# Patient Record
Sex: Female | Born: 1960 | Race: White | Hispanic: No | Smoking: Current every day smoker
Health system: Southern US, Community
[De-identification: ages and names within clinical notes are randomized; demographics above are authoritative.]

## PROBLEM LIST (undated history)

## (undated) ENCOUNTER — Ambulatory Visit: Admission: EM | Discharge status: Absent without leave | Payer: 59

## (undated) DIAGNOSIS — K559 Vascular disorder of intestine, unspecified: Secondary | ICD-10-CM

## (undated) HISTORY — PX: SIGMOIDOSCOPY: SUR1295

## (undated) HISTORY — PX: COLONOSCOPY: SHX174

---

## 2006-05-04 ENCOUNTER — Emergency Department: Payer: Self-pay | Admitting: Internal Medicine

## 2008-04-01 ENCOUNTER — Ambulatory Visit: Payer: Self-pay | Admitting: Family Medicine

## 2009-11-10 ENCOUNTER — Ambulatory Visit: Payer: Self-pay | Admitting: Family Medicine

## 2009-11-19 ENCOUNTER — Ambulatory Visit: Payer: Self-pay | Admitting: Family Medicine

## 2011-07-06 ENCOUNTER — Ambulatory Visit: Payer: Self-pay | Admitting: Family Medicine

## 2011-11-19 ENCOUNTER — Ambulatory Visit: Payer: Self-pay | Admitting: Internal Medicine

## 2012-07-22 LAB — LIPID PANEL
Cholesterol: 138 mg/dL (ref 0–200)
HDL: 63 mg/dL (ref 35–70)
LDL Cholesterol: 66 mg/dL
TRIGLYCERIDES: 46 mg/dL (ref 40–160)

## 2013-05-29 ENCOUNTER — Ambulatory Visit: Payer: Self-pay | Admitting: Family Medicine

## 2013-06-01 LAB — BETA STREP CULTURE(ARMC)

## 2013-06-02 ENCOUNTER — Ambulatory Visit: Payer: Self-pay

## 2013-06-05 ENCOUNTER — Emergency Department: Payer: Self-pay | Admitting: Internal Medicine

## 2013-07-26 LAB — HM PAP SMEAR: HM PAP: NORMAL

## 2013-07-26 LAB — CBC AND DIFFERENTIAL: HEMOGLOBIN: 14.4 g/dL (ref 12.0–16.0)

## 2013-07-26 LAB — BASIC METABOLIC PANEL
BUN: 11 mg/dL (ref 4–21)
Creatinine: 0.8 mg/dL (ref <=1.1)

## 2013-07-26 LAB — TSH: TSH: 2.8 u[IU]/mL (ref <=5.90)

## 2014-06-16 ENCOUNTER — Ambulatory Visit: Payer: Self-pay | Admitting: Family Medicine

## 2014-06-16 LAB — RAPID STREP-A WITH REFLX: Micro Text Report: NEGATIVE

## 2014-06-19 LAB — BETA STREP CULTURE(ARMC)

## 2014-09-13 ENCOUNTER — Inpatient Hospital Stay: Payer: Self-pay | Admitting: Internal Medicine

## 2014-09-13 LAB — COMPREHENSIVE METABOLIC PANEL
ANION GAP: 5 — AB (ref 7–16)
AST: 35 U/L (ref 15–37)
Albumin: 4.2 g/dL (ref 3.4–5.0)
Alkaline Phosphatase: 186 U/L — ABNORMAL HIGH
BUN: 23 mg/dL — AB (ref 7–18)
Bilirubin,Total: 0.6 mg/dL (ref 0.2–1.0)
CREATININE: 0.98 mg/dL (ref 0.60–1.30)
Calcium, Total: 9.2 mg/dL (ref 8.5–10.1)
Chloride: 108 mmol/L — ABNORMAL HIGH (ref 98–107)
Co2: 25 mmol/L (ref 21–32)
EGFR (African American): >60
EGFR (Non-African Amer.): >60
Glucose: 106 mg/dL — ABNORMAL HIGH (ref 65–99)
Osmolality: 280 (ref 275–301)
POTASSIUM: 4.6 mmol/L (ref 3.5–5.1)
SGPT (ALT): 27 U/L
SODIUM: 138 mmol/L (ref 136–145)
TOTAL PROTEIN: 7.7 g/dL (ref 6.4–8.2)

## 2014-09-13 LAB — CBC WITH DIFFERENTIAL/PLATELET
BASOS ABS: 0 10*3/uL (ref 0.0–0.1)
BASOS PCT: 0.4 %
Basophil #: 0.1 10*3/uL (ref 0.0–0.1)
Basophil %: 0.9 %
EOS ABS: 0 10*3/uL (ref 0.0–0.7)
Eosinophil #: 0.2 10*3/uL (ref 0.0–0.7)
Eosinophil %: 0.4 %
Eosinophil %: 2.9 %
HCT: 48 % — AB (ref 35.0–47.0)
HCT: 41.8 % (ref 35.0–47.0)
HGB: 15.5 g/dL (ref 12.0–16.0)
HGB: 13.8 g/dL (ref 12.0–16.0)
LYMPHS ABS: 1 10*3/uL (ref 1.0–3.6)
LYMPHS PCT: 8.8 %
Lymphocyte #: 2 10*3/uL (ref 1.0–3.6)
Lymphocyte %: 25.5 %
MCH: 31.2 pg (ref 26.0–34.0)
MCH: 31.6 pg (ref 26.0–34.0)
MCHC: 32.4 g/dL (ref 32.0–36.0)
MCHC: 32.9 g/dL (ref 32.0–36.0)
MCV: 96 fL (ref 80–100)
MCV: 96 fL (ref 80–100)
MONO ABS: 0.6 x10 3/mm (ref 0.2–0.9)
MONOS PCT: 8.3 %
Monocyte #: 0.9 x10 3/mm (ref 0.2–0.9)
Monocyte %: 7.6 %
NEUTROS PCT: 63.1 %
Neutrophil #: 9.4 10*3/uL — ABNORMAL HIGH (ref 1.4–6.5)
Neutrophil #: 4.9 10*3/uL (ref 1.4–6.5)
Neutrophil %: 82.1 %
PLATELETS: 172 10*3/uL (ref 150–440)
Platelet: 201 10*3/uL (ref 150–440)
RBC: 4.97 10*6/uL (ref 3.80–5.20)
RBC: 4.35 10*6/uL (ref 3.80–5.20)
RDW: 13.5 % (ref 11.5–14.5)
RDW: 13.4 % (ref 11.5–14.5)
WBC: 11.5 10*3/uL — ABNORMAL HIGH (ref 3.6–11.0)
WBC: 7.7 10*3/uL (ref 3.6–11.0)

## 2014-09-13 LAB — URINALYSIS, COMPLETE
BACTERIA: NONE SEEN
BILIRUBIN, UR: NEGATIVE
BLOOD: NEGATIVE
Glucose,UR: NEGATIVE mg/dL (ref 0–75)
Leukocyte Esterase: NEGATIVE
NITRITE: NEGATIVE
PROTEIN: NEGATIVE
Ph: 5 (ref 4.5–8.0)
RBC,UR: 1 /HPF (ref 0–5)
Specific Gravity: 1.019 (ref 1.003–1.030)
Squamous Epithelial: NONE SEEN
WBC UR: 1 /HPF (ref 0–5)

## 2014-09-14 LAB — CBC WITH DIFFERENTIAL/PLATELET
BASOS ABS: 0.1 10*3/uL (ref 0.0–0.1)
Basophil %: 2 %
Eosinophil #: 0.2 10*3/uL (ref 0.0–0.7)
Eosinophil %: 3.5 %
HCT: 42.4 % (ref 35.0–47.0)
HGB: 13.8 g/dL (ref 12.0–16.0)
LYMPHS ABS: 2 10*3/uL (ref 1.0–3.6)
Lymphocyte %: 29.2 %
MCH: 31.5 pg (ref 26.0–34.0)
MCHC: 32.7 g/dL (ref 32.0–36.0)
MCV: 97 fL (ref 80–100)
Monocyte #: 0.5 x10 3/mm (ref 0.2–0.9)
Monocyte %: 7.4 %
Neutrophil #: 3.9 10*3/uL (ref 1.4–6.5)
Neutrophil %: 57.9 %
PLATELETS: 172 10*3/uL (ref 150–440)
RBC: 4.39 10*6/uL (ref 3.80–5.20)
RDW: 13.5 % (ref 11.5–14.5)
WBC: 6.7 10*3/uL (ref 3.6–11.0)

## 2014-09-14 LAB — BASIC METABOLIC PANEL
ANION GAP: 7 (ref 7–16)
BUN: 13 mg/dL (ref 7–18)
Calcium, Total: 8.2 mg/dL — ABNORMAL LOW (ref 8.5–10.1)
Chloride: 111 mmol/L — ABNORMAL HIGH (ref 98–107)
Co2: 21 mmol/L (ref 21–32)
Creatinine: 0.79 mg/dL (ref 0.60–1.30)
EGFR (African American): >60
EGFR (Non-African Amer.): >60
Glucose: 83 mg/dL (ref 65–99)
Osmolality: 277 (ref 275–301)
POTASSIUM: 4.1 mmol/L (ref 3.5–5.1)
Sodium: 139 mmol/L (ref 136–145)

## 2014-11-07 ENCOUNTER — Ambulatory Visit: Payer: Self-pay | Admitting: Gastroenterology

## 2014-11-07 DIAGNOSIS — K559 Vascular disorder of intestine, unspecified: Secondary | ICD-10-CM | POA: Insufficient documentation

## 2015-01-20 NOTE — Discharge Summary (Signed)
PATIENT NAME:  Yvette Barr, Yvette Barr MR#:  [id number] DATE OF BIRTH:  [birth year]  DATE OF ADMISSION:  09/13/2014 DATE OF DISCHARGE:  09/15/2014  PRESENTING COMPLAINT:  Rectal bleeding.   DISCHARGE DIAGNOSES:  1.  Acute colitis.  2.  Flexible sigmoidoscopy showed congested, inflamed and ulcerated mucosa in the proximal sigmoid colon and in the mid sigmoid colon. Diverticulosis in the sigmoid colon.   CODE STATUS: FULL CODE.   MEDICATIONS:  1.  Flagyl 500 mg t.i.d.  2.  Cipro 500 mg b.i.d.  3.  Acetaminophen 2 tablets every four hours as needed.   DISCHARGE DIET: A full liquid for 3 to 4 days then low residue diet.   FOLLOWUP:  1.  Dr. Gustavo Lah in 2 to 4 weeks.  2.  Follow up with Dr. Edilia Bo your primary care physician.   GASTROENTEROLOGY CONSULTATION:  Dr. Gustavo Lah.   LABORATORY DATA: On discharge CBC and basic metabolic panel within normal limits.    VITAL SIGNS:  On discharge:  Temperature was 97.9, pulse is 55. Blood pressure is 115/76 saturations are 98% on room air.   PHYSICAL EXAMINATION:  RESPIRATORY: Clear to auscultation bilaterally. No rales, rhonchi, respiratory distress or labored breathing.  CARDIOVASCULAR: Both the heart sounds are normal. No leg edema.  ABDOMEN:  No tenderness. No liver, spleen enlargement, no hernias. Bowels soft, on palpation, normal sounds.  EXTREMITIES: Negative for edema.  SKIN: No rashes or ulcer.  NEUROLOGIC: Clinically intact.   BRIEF SUMMARY OF HOSPITAL COURSE: Yvette Barr is a 54 year old Caucasian female with no past medical history who comes to the Emergency Room with:  1.  Acute left-sided colitis. She was started on IV Cipro Flagyl, white count was stable, GI consultation was done with Dr. Gustavo Lah. Flexible sigmoidoscopy was done, results as above were noted. The patient was switched to p.o. Flagyl. Cipro, will follow up with Dr. Gustavo Lah in 2 to 4 weeks.  2.  Chronic tobacco abuse, advised smoking cessation.  Overall hospital stay otherwise  remained stable.   CODE STATUS: THE PATIENT REMAINED A FULL CODE.    TIME SPENT:  Forty minutes.   ____________________________ Hart Rochester Posey Pronto, MD sap:at D: 09/25/2014 10:25:13 ET T: 09/25/2014 12:18:34 ET JOB#: [id number]  cc: Amillion Scobee A. Posey Pronto, MD, <Dictator> Lollie Sails, MD Owens Loffler, MD  Ilda Basset MD ELECTRONICALLY SIGNED 09/26/2014 18:38

## 2015-01-20 NOTE — Consult Note (Signed)
PATIENT NAME:  Yvette Barr, MCMILLEN MR#:  [id number] DATE OF BIRTH:  [birth year]  DATE OF CONSULTATION:  09/14/2014  REFERRING PHYSICIAN:  Dr.  Bridgett Larsson CONSULTING PHYSICIAN:  Lollie Sails, MD   REASON FOR CONSULTATION: Rectal bleeding.   HISTORY OF PRESENT ILLNESS: Yvette Barr is a pleasant 54 year old Caucasian female who was in her usual state of health until yesterday at about 10 to 11 in the morning. She began to have some abdominal crampiness in the lower aspect of the abdomen, particularly suprapubic. This was then followed by a bloody bowel movement.  This was bright red in nature.  She has never had an episode of this previously.  Her pain is low in the central abdomen. It is somewhat better today than it was yesterday. There were several episodes yesterday and 2 episodes overnight.  She has been hemodynamically stable.  Her hemogram has been stable, as well. She does have a history of hemorrhoids.  She has never had a colonoscopy in the past. She has never had peptic ulcer disease. She does take Aleve PM every evening.  She denies any heartburn or dysphagia.  She states that on this episode of bleeding, when she first got the pain, there was some nausea but no vomiting, and sometime later after the rectal bleeding started, there was no further nausea.  She generally has a bowel movement once a week, does have problems with constipation.  There has been no black stools or mucousy stools.  She has tried Metamucil-type products in the past for constipation unsuccessfully.  About a year ago she had a gallbladder evaluation due to epigastric discomfort. She was placed on Nexium which she took for about a month, has not taken it since, however, has minimal epigastric discomfort currently. She has been started on IV antibiotics on admission.   PAST MEDICAL HISTORY: Denies any previous medical treatment or surgeries.   SOCIAL HISTORY: She does smoke 1/2 pack of cigarettes daily. She does not drink alcohol or use  illicit drugs.   GASTROINTESTINAL FAMILY HISTORY: Negative for colorectal cancer, liver disease or ulcers.   REVIEW OF SYSTEMS:  Ten systems reviewed per admission history and physical, agree with same.   PHYSICAL EXAMINATION:  VITAL SIGNS: Temperature is 97.5, pulse 50, respirations 17, blood pressure 119/79, pulse oximetry 98%.  GENERAL: She is a thin 54 year old Caucasian female in no acute distress.  HEENT: Normocephalic, atraumatic.  EYES: Anicteric.  NOSE: Septum midline. No lesions.  OROPHARYNX: No lesions.  NECK: Supple. No JVD.  HEART: Regular rate and rhythm without rub or gallop.  LUNGS: Bilaterally clear.  ABDOMEN: Soft. She is tender to palpation across the lower abdomen particularly centrally between the umbilicus and pubic arch. There are no masses, rebound, or organomegaly. Bowel sounds positive, normoactive.  RECTAL: Anorectal examination shows a pinkish. more bright-ish red scant effluent. This is brightly Hemoccult positive. There are no masses noted. EXTREMITIES: No clubbing, cyanosis, or edema.  NEUROLOGICAL: Cranial nerves II through XII grossly intact. Muscle strength bilaterally equal and symmetric.   LABORATORY, DIAGNOSTIC, AND RADIOLOGICAL DATA:  Include the following: On admission to the hospital yesterday afternoon she had a glucose of 106, BUN 23, creatinine 0.98, sodium 138, potassium 4.6, chloride 108, bicarbonate 25, calcium 9.2. Hepatic profile normal with the exception of slight elevation of alkaline phosphatase at 186.  Her hemogram showed a white count of 11.5, hemoglobin and hematocrit 15.5 and 48.0, platelet count 201,000.  MCV 96. She has had 2 hemograms subsequent, 1 late  last night, 1 early this morning; both of these showing a 13.8 hemoglobin.  Urinalysis was normal.  She did have an abdominal pelvic CT scan with contrast due to the bloody stool and suprapubic abdominal pain. The stomach was noted to be normal without wall thickening. There was some wall  thickening noted in the sigmoid, descending and possibly distal transverse colon which was described as moderate. Impression on the scan was that she had a left-sided colitis "given the history, ischemia is possible, although there is age-advanced atherosclerosis, the mesenteric vessels are patent. Differential considerations would include infectious colitis. Inflammatory bowel disease was felt unlikely given the absence or rectal involvement per CT."  It is of note, that she had no prodrome of loose stools prior to this episode of bleeding.   ASSESSMENT:   Hematochezia, abnormal CT scan and possibly consistent with colitis, etiology undetermined. No previous episodes. The patient does take NSAIDs regularly, however is not on a blood thinner.  Review of her previous medications indicates that she has probably been treated for asthma-possible chronic obstructive pulmonary disease type things in the past including antibiotics and Medrol dose packs.   RECOMMENDATION:  We will proceed today with an un-sedated flexible sigmoidoscopy.  Depending on this result, we may need to pursue full colonoscopy. I have discussed the risks, benefits, and complications of flexible sigmoidoscopy. to include but not limited to, bleeding, infection, perforation. She wishes to proceed. I have discussed with her that we may need to use sedation if she is uncomfortable. Further recommendations to follow.  Discuss with Dr. Fritzi Mandes.    ____________________________ Lollie Sails, MD mus:DT D: 09/14/2014 12:47:00 ET T: 09/14/2014 13:18:04 ET JOB#: [id number]  cc: Lollie Sails, MD, <Dictator> Lollie Sails MD ELECTRONICALLY SIGNED 09/19/2014 1:43

## 2015-01-20 NOTE — H&P (Signed)
PATIENT NAME:  Yvette Barr, Yvette Barr MR#:  [id number] DATE OF BIRTH:  02-13-1961  DATE OF ADMISSION:  09/13/2014  PRIMARY CARE PHYSICIAN:  Copland, MD   REFERRING PHYSICIAN:  Lenard Lance, MD   CHIEF COMPLAINT: Rectal bleeding today.   HISTORY OF PRESENT ILLNESS: A 54 year old Caucasian female with no past medical history presented to the ED with rectal bleeding today. The patient is alert and oriented, in no acute distress. The patient has started to have abdominal pain in the lower abdomen, which is aching in the morning. The patient also has some nausea but no vomiting, but the patient started to have rectal bleeding in the afternoon, which is about 6 times, total about half a cup of fresh blood. The patient denies any fever or chills, denies any melena, denies any dysuria or hematuria.   The patient got a CAT scan in the ED which showed left side colitis.   The patient was treated with Cipro and Flagyl 1 dose in the ED.   PAST MEDICAL HISTORY: None.   PAST SURGICAL HISTORY: None.   SOCIAL HISTORY: Smokes 1/2 pack a day since teenager; no alcohol drinking, or illicit drugs.   FAMILY HISTORY: No hypertension, diabetes, cancer, heart attack or stroke.   ALLERGIES: CODEINE   HOME MEDICATIONS: None.   REVIEW OF SYSTEMS:   CONSTITUTIONAL: The patient denies any fever or chills. No headache or dizziness. No weakness.  EYES: No double vision or blurry vision.  EARS AND NOSE AND THROAT: No postnasal drip, slurred speech or dysphagia.  CARDIOVASCULAR: No chest pain, palpitation, orthopnea, or nocturnal dyspnea. No leg edema.  PULMONARY: No cough, sputum, shortness of breath, or hematemesis.  GASTROINTESTINAL: Positive for abdominal pain, nausea, and bloody stool. No vomiting or  diarrhea. No melena.  GENITOURINARY:  No dysuria, hematuria, or incontinence.  SKIN: No rash or jaundice.  NEUROLOGIC: No syncope, loss of consciousness, or seizure.  HEMATOLOGIC: No easy bruising or bleeding.   ENDOCRINE: No polyuria, polydipsia, heat or cold intolerance.   VITAL SIGNS: Temperature 97.3, blood pressure 134/92, pulse 56, with O2 saturation 100% on room air.   PHYSICAL EXAMINATION:  GENERAL: The patient is alert, awake, oriented, in no acute distress.  HEENT: Pupils round, equal and reactive to light and accommodation. Moist oral mucosa. Clear oropharynx.  NECK: Supple. No JVD or carotid bruit. No lymphadenopathy. No thyromegaly.  CARDIOVASCULAR: S1 and S2. Regular rate and rhythm. No murmurs or gallops.  PULMONARY: Bilateral air entry. No wheezing or rales. No use of accessory muscle to breathe.  ABDOMEN: Soft. No distention but has mild tenderness in the middle of the abdomen. No rigidity. No rebound. Bowel sounds present.  No organomegaly.  EXTREMITIES: No edema, clubbing or cyanosis. No calf tenderness. Bilateral pedal pulses present.  SKIN: No rash or jaundice.  NEUROLOGIC: A and O x 3. No focal deficit. Power 5/5. Sensation intact.   DIAGNOSTIC DATA: CAT scan of the abdomen and pelvis showing left-sided colitis.  Urinalysis is negative. WBC 11.5, hemoglobin 15.5, platelets 201,000, glucose 106, BUN 23, creatinine 0.98, electrolytes normal.   IMPRESSIONS: 1.  Acute colitis.  2.  Dehydration.  3.  Gastrointestinal bleeding.  4.  Tobacco abuse.   PLAN OF TREATMENT: 1.  The patient will be admitted to the medical floor. We will continue Cipro, Flagyl for acute colitis.  2.  For GI bleeding we will follow up hemoglobin. Start Protonix 40 mg IV b.i.d. we will get a GI consult.  3.  For dehydration,  we will start IV fluid support and follow up BMP.  4.  For tobacco abuse, smoking cessation was counseled for 3 to 4 minutes, we will give nicotine patch.   I discussed the patient's condition and plan of treatment with the patient, the patient wants full code.   TIME SPENT: About 48 minutes.    ____________________________ Demetrios Loll, MD qc:nt D: 09/13/2014 18:16:05  ET T: 09/13/2014 18:59:28 ET JOB#: [id number]  cc: Demetrios Loll, MD, <Dictator> Demetrios Loll MD ELECTRONICALLY SIGNED 09/14/2014 13:34

## 2015-01-20 NOTE — Consult Note (Signed)
Brief Consult Note: Diagnosis: hematochezia, abnormal ct scan, lower abdominal pain.   Patient was seen by consultant.   Consult note dictated.   Recommend to proceed with surgery or procedure.   Recommend further assessment or treatment.   Orders entered.   Discussed with Attending MD.   Comments: Please see ful GI consult [phone].  Patietn presenting after episodes of hematochezia that started after onset of lower abdominal pain.  Hemodynamically stable.  Considerations from CT of possible ischemic/infective/inflammatory colitis.   Will do flexible sigmoidoscopy this afternoon.  I have discussed the risks benefits and complications of proceedure to include not limited to bleeding infection perforation and sedation and she wishes to proceed.   Further recs to follow.  Electronic Signatures: Loistine Simas (MD)  (Signed 17-Dec-15 12:55)  Authored: Brief Consult Note   Last Updated: 17-Dec-15 12:55 by Loistine Simas (MD)

## 2015-01-22 LAB — SURGICAL PATHOLOGY

## 2015-05-29 ENCOUNTER — Ambulatory Visit (INDEPENDENT_AMBULATORY_CARE_PROVIDER_SITE_OTHER): Payer: Managed Care, Other (non HMO) | Admitting: Family Medicine

## 2015-05-29 ENCOUNTER — Telehealth: Payer: Self-pay

## 2015-05-29 ENCOUNTER — Encounter: Payer: Self-pay | Admitting: Family Medicine

## 2015-05-29 VITALS — BP 120/80 | HR 72 | Temp 98.1°F | Ht 66.0 in | Wt 147.0 lb

## 2015-05-29 DIAGNOSIS — J0100 Acute maxillary sinusitis, unspecified: Secondary | ICD-10-CM | POA: Diagnosis not present

## 2015-05-29 DIAGNOSIS — J40 Bronchitis, not specified as acute or chronic: Secondary | ICD-10-CM

## 2015-05-29 MED ORDER — BENZONATATE 100 MG PO CAPS: 100.0000 mg | ORAL_CAPSULE | Freq: Two times a day (BID) | ORAL | Status: DC | PRN

## 2015-05-29 MED ORDER — ALBUTEROL SULFATE HFA 108 (90 BASE) MCG/ACT IN AERS: 2.0000 | INHALATION_SPRAY | Freq: Four times a day (QID) | RESPIRATORY_TRACT | Status: DC | PRN

## 2015-05-29 MED ORDER — AMOXICILLIN 500 MG PO CAPS: 500.0000 mg | ORAL_CAPSULE | Freq: Three times a day (TID) | ORAL | Status: DC

## 2015-05-29 NOTE — Patient Instructions (Signed)
Smoking: Ways to Quit   Know why you want to quit.   When you quit smoking, your body gets to work repairing damaged tissues. Here are some of the health benefits:   You stop the destruction of your lungs.  Your lungs are better able to fight colds, and other respiratory infections.  You decrease your risk of cancer, heart disease, strokes, and circulation problems.   In addition, when you quit you will:   Feel more in control of your life.  Have better smelling hair, breath, clothes, home, and car.  Have more stamina for activities.  Save money.  Protect your family and friends from the dangers of secondhand smoke.   Smoking is an addictive habit. Most former smokers make several attempts to quit before they finally succeed. So, never say, "I can't." Just keep trying.   Set a quit date.   Set a date for when you will stop smoking. Don't buy cigarettes to carry you beyond your last day. Tell your family and friends you plan to quit, and ask for their support and encouragement. Ask them not to offer you cigarettes.  Make a plan.   5 Days Before Your Quit Date   Think about your reasons for quitting.  Tell your friends and family you are planning to quit.  Stop buying cigarettes.   4 Days Before Your Quit Date   Pay attention to when and why you smoke.  Think of other things to hold in your hand instead of a cigarette.  Think of habits or routines to change.   3 Days Before Your Quit Date   Plan what you will do with the extra money when you stop buying cigarettes.  Think of whom you can reach out to when you need help.   2 Days Before Your Quit Date   Consider buying nonprescription nicotine patches or nicotine gum. Or see your health care provider to get a prescription for the nicotine inhaler, nasal spray, or other medicine that can help.     1 Day Before Your Quit Date   Put away lighters and ashtrays.  Throw away all cigarettes and matches - no emergency stashes  are allowed!  Clean your clothes to get rid of the smell of cigarette smoke.   Quit Day   Keep very busy.  Remind family and friends that this is your quit day.  Stay away from alcohol.  Stay away from places where you used to smoke and people you used to smoke with.  Give yourself a treat or do something else special.   Commit to staying quit.   Make sure that all your cigarettes and ashtrays are thrown away.   If you keep cigarettes or ashtrays around, sooner or later you'll break down and smoke one, then another, then another, and so on. Throw them away. Make it hard to start again.   Because you are used to having something in your mouth, you may want to chew gum as a substitute for smoking. Or munch on carrots or celery.   Spend time with nonsmokers rather than with smokers.   Think of yourself as a nonsmoker. Tell other people that you are a nonsmoker (for example, in restaurants). Stay away from places where there are a lot of smokers, such as bars. Avoid spending time with smokers. You can't tell others not to smoke, but you don't have to sit with them while they do. Plan on walking away from cigarette smoke. Spend time   with nonsmokers and sit in the nonsmoking section of restaurants.   Be prepared for relapse or difficult situations.   Most people who go back to smoking cigarettes do so within the first 3 months after quitting. Many people try 5 or more times before they successfully quit. Avoid drinking alcohol, because it lowers your chances of success. Don't be distracted by the weight you may gain after quitting. Smokers usually do not gain more than 10 pounds when they stop smoking. Learn new ways to improve your mood and overcome depression.   Start an exercise program.   As you become more fit, you will not want the nicotine effects in your body. Regular exercise will also help keep you from gaining weight.   Keep your hands busy.   You may not know what to do with  your hands for a while. Try reading, knitting, needlework, pottery, drawing, making a plastic model, or doing a jigsaw puzzle. Join special interest groups that keep you involved in your hobby.      Take on new activities.   Change your routine. Take on new activities that don't include smoking. Join an exercise group and work out regularly. Sign up for an evening class or a join a study group at your place of worship. Go on more outings with your family or friends. Learn ways to relax and manage stress.   Join a quit-smoking program if it helps.   Some people do better in groups, or with a set of instructions to follow. That's fine, too. Remember, the goal is to quit smoking. It doesn't matter how you do it.   Consider using nicotine replacement therapy.   Nicotine is the drug that is in tobacco. You can use nicotine patches or gum, available without a prescription at your local pharmacy, to help you quit smoking. Quitting smoking is a two-step process. It includes breaking the physical addiction to nicotine and breaking the smoking habit. Nicotine replacement helps take care of the nicotine addiction so that you can focus on breaking the habit.   Your health care provider can prescribe nicotine substitutes that can almost double your chances of quitting for good. They are:   Zyban (bupropion HCL)  nicotine inhaler  nicotine lozenge  nicotine nasal spray  nicotine patch.   None of these treatments is a miracle cure. Quitting can be hard work, but you can learn to live without cigarettes in your daily life.  

## 2015-05-29 NOTE — Progress Notes (Signed)
Name: Yvette Barr   MRN: [id number]    DOB: 08/18/1961   Date:05/29/2015       Progress Note  Subjective  Chief Complaint  Chief Complaint  Patient presents with  . Sinusitis    pressure in face, teeth hurt, yellow production, sores in nose x 2 days    Sinusitis This is a new problem. The current episode started in the past 7 days. The problem has been gradually worsening since onset. The maximum temperature recorded prior to her arrival was 100.4 - 100.9 F. The pain is moderate (upper tooth pain). Associated symptoms include chills, congestion, coughing, diaphoresis, a hoarse voice, shortness of breath, sinus pressure, a sore throat and swollen glands. Pertinent negatives include no ear pain, headaches or neck pain. (Productive yellow) Past treatments include acetaminophen and oral decongestants. The treatment provided no relief.    No problem-specific assessment & plan notes found for this encounter.   History reviewed. No pertinent past medical history.  History reviewed. No pertinent past surgical history.  History reviewed. No pertinent family history.  Social History   Social History  . Marital Status: Married    Spouse Name: N/A  . Number of Children: N/A  . Years of Education: N/A   Occupational History  . Not on file.   Social History Main Topics  . Smoking status: Current Every Day Smoker  . Smokeless tobacco: Not on file  . Alcohol Use: 0.0 oz/week    0 Standard drinks or equivalent per week  . Drug Use: No  . Sexual Activity: Yes   Other Topics Concern  . Not on file   Social History Narrative  . No narrative on file    Allergies  Allergen Reactions  . Codeine   . Morphine And Related      Review of Systems  Constitutional: Positive for chills and diaphoresis. Negative for fever, weight loss and malaise/fatigue.  HENT: Positive for congestion, hoarse voice, sinus pressure and sore throat. Negative for ear discharge and ear pain.   Eyes:  Negative for blurred vision.  Respiratory: Positive for cough and shortness of breath. Negative for sputum production and wheezing.   Cardiovascular: Negative for chest pain, palpitations and leg swelling.  Gastrointestinal: Negative for heartburn, nausea, abdominal pain, diarrhea, constipation, blood in stool and melena.  Genitourinary: Negative for dysuria, urgency, frequency and hematuria.  Musculoskeletal: Negative for myalgias, back pain, joint pain and neck pain.  Skin: Negative for rash.  Neurological: Negative for dizziness, tingling, sensory change, focal weakness and headaches.  Endo/Heme/Allergies: Negative for environmental allergies and polydipsia. Does not bruise/bleed easily.  Psychiatric/Behavioral: Negative for depression and suicidal ideas. The patient is not nervous/anxious and does not have insomnia.      Objective  Filed Vitals:   05/29/15 1338  BP: 120/80  Pulse: 72  Temp: 98.1 F (36.7 C)  TempSrc: Oral  Height: 5\' 6"  (1.676 m)  Weight: 147 lb (66.679 kg)  SpO2: 98%    Physical Exam  Constitutional: She is well-developed, well-nourished, and in no distress. No distress.  HENT:  Head: Normocephalic and atraumatic.  Right Ear: External ear normal.  Left Ear: External ear normal.  Nose: Nose normal.  Mouth/Throat: Oropharynx is clear and moist.  Eyes: Conjunctivae and EOM are normal. Pupils are equal, round, and reactive to light. Right eye exhibits no discharge. Left eye exhibits no discharge.  Neck: Normal range of motion. Neck supple. No JVD present. No thyromegaly present.  Cardiovascular: Normal rate, regular rhythm, normal  heart sounds and intact distal pulses.  Exam reveals no gallop and no friction rub.   No murmur heard. Pulmonary/Chest: Effort normal and breath sounds normal.  Abdominal: Soft. Bowel sounds are normal. She exhibits no mass. There is no tenderness. There is no guarding.  Musculoskeletal: Normal range of motion. She exhibits no  edema.  Lymphadenopathy:    She has no cervical adenopathy.  Neurological: She is alert. She has normal reflexes.  Skin: Skin is warm and dry. She is not diaphoretic.  Psychiatric: Mood and affect normal.      Assessment & Plan  Problem List Items Addressed This Visit    None    Visit Diagnoses    Bronchitis    -  Primary    Relevant Medications    amoxicillin (AMOXIL) 500 MG capsule    benzonatate (TESSALON) 100 MG capsule    albuterol (PROVENTIL HFA;VENTOLIN HFA) 108 (90 BASE) MCG/ACT inhaler    Acute maxillary sinusitis, recurrence not specified        Relevant Medications    amoxicillin (AMOXIL) 500 MG capsule    benzonatate (TESSALON) 100 MG capsule         Dr. Aaima Gaddie [facility]  05/29/2015

## 2015-07-26 ENCOUNTER — Encounter: Payer: Self-pay | Admitting: Internal Medicine

## 2015-07-26 DIAGNOSIS — G4700 Insomnia, unspecified: Secondary | ICD-10-CM | POA: Insufficient documentation

## 2015-07-26 DIAGNOSIS — R636 Underweight: Secondary | ICD-10-CM | POA: Insufficient documentation

## 2015-07-26 DIAGNOSIS — F17200 Nicotine dependence, unspecified, uncomplicated: Secondary | ICD-10-CM | POA: Insufficient documentation

## 2015-07-26 DIAGNOSIS — K5900 Constipation, unspecified: Secondary | ICD-10-CM | POA: Insufficient documentation

## 2015-07-27 ENCOUNTER — Encounter: Payer: Self-pay | Admitting: Internal Medicine

## 2015-07-27 ENCOUNTER — Ambulatory Visit (INDEPENDENT_AMBULATORY_CARE_PROVIDER_SITE_OTHER): Payer: Managed Care, Other (non HMO) | Admitting: Internal Medicine

## 2015-07-27 VITALS — BP 104/64 | HR 72 | Ht 66.0 in | Wt 149.6 lb

## 2015-07-27 DIAGNOSIS — J3489 Other specified disorders of nose and nasal sinuses: Secondary | ICD-10-CM

## 2015-07-27 DIAGNOSIS — K559 Vascular disorder of intestine, unspecified: Secondary | ICD-10-CM

## 2015-07-27 DIAGNOSIS — Z1239 Encounter for other screening for malignant neoplasm of breast: Secondary | ICD-10-CM

## 2015-07-27 DIAGNOSIS — F17200 Nicotine dependence, unspecified, uncomplicated: Secondary | ICD-10-CM | POA: Diagnosis not present

## 2015-07-27 DIAGNOSIS — K5901 Slow transit constipation: Secondary | ICD-10-CM | POA: Diagnosis not present

## 2015-07-27 NOTE — Patient Instructions (Signed)
Use Saline nasal spray as needed Apply neosporin ointment to right nostril at bedtime

## 2015-07-27 NOTE — Progress Notes (Signed)
Date:  07/27/2015   Name:  Yvette Barr   DOB:  03-Feb-1961   MRN:  [id number]   Chief Complaint: Employment Physical  Patient presents for a physical exam for employment. She does needs documentation to affect her insurance company. She is feeling well. Recovered from a recent sinus infection. She does still complain of crusting in the right nostril with some dried blood every morning. Is concerned about how that should be treated. She was hospitalized last year with constipation and abdominal pain. Colonoscopy showed ischemic colitis. She was prescribed Amitiza for constipation which has been of no benefit. No further problems with pain or bleeding has been noted.  Review of Systems  Constitutional: Negative for fever, chills and fatigue.  HENT: Positive for nosebleeds and sinus pressure. Negative for ear discharge, hearing loss, rhinorrhea and sore throat.   Eyes: Negative for visual disturbance.  Respiratory: Negative for cough, shortness of breath and wheezing.   Cardiovascular: Negative for chest pain.  Gastrointestinal: Positive for constipation. Negative for vomiting, abdominal pain and blood in stool.  Genitourinary: Negative for difficulty urinating.  Musculoskeletal: Negative for back pain and joint swelling.  Neurological: Negative for light-headedness and headaches.  Hematological: Negative for adenopathy.  Psychiatric/Behavioral: Negative for sleep disturbance and dysphoric mood.    Patient Active Problem List   Diagnosis Date Noted  . Constipation 07/26/2015  . Tobacco use disorder 07/26/2015  . Insomnia 07/26/2015  . Underweight 07/26/2015  . Ischemic colitis ([place]) 11/07/2014    Prior to Admission medications   Medication Sig Start Date End Date Taking? Authorizing Provider  albuterol (PROVENTIL HFA;VENTOLIN HFA) 108 (90 BASE) MCG/ACT inhaler Inhale 2 puffs into the lungs every 6 (six) hours as needed for wheezing or shortness of breath. 05/29/15  Yes Juline Patch, MD     Allergies  Allergen Reactions  . Codeine   . Morphine And Related     No past surgical history on file.  Social History  Substance Use Topics  . Smoking status: Current Every Day Smoker  . Smokeless tobacco: None  . Alcohol Use: 0.0 oz/week    0 Standard drinks or equivalent per week     Medication list has been reviewed and updated.   Physical Exam  Constitutional: She is oriented to person, place, and time. She appears well-developed and well-nourished. No distress.  HENT:  Head: Normocephalic and atraumatic.  Nose: Mucosal edema and sinus tenderness present.  Mouth/Throat: Uvula is midline and oropharynx is clear and moist.  Eyes: Conjunctivae are normal. Right eye exhibits no discharge. Left eye exhibits no discharge. No scleral icterus.  Cardiovascular: Normal rate, regular rhythm and normal heart sounds.   Pulmonary/Chest: Effort normal and breath sounds normal. No respiratory distress.  Abdominal: Soft. Normal appearance. She exhibits no mass. There is no tenderness. There is no guarding.  Musculoskeletal: Normal range of motion.  Neurological: She is alert and oriented to person, place, and time. She has normal reflexes.  Skin: Skin is warm and dry. No rash noted.  Psychiatric: She has a normal mood and affect. Her behavior is normal. Thought content normal.  Nursing note and vitals reviewed.   BP 104/64 mmHg  Pulse 72  Ht 5\' 6"  (1.676 m)  Wt 149 lb 9.6 oz (67.858 kg)  BMI 24.16 kg/m2  Assessment and Plan: 1. Nasal obstruction Begin saline nasal spray and topical Neosporin at bedtime ENT evaluation if persistent  2. Slow transit constipation Consider higher dose Amitiza  3. Ischemic colitis ([place]) Seen  on colonoscopy; patient is encouraged to quit smoking again  4. Breast cancer screening Patient is given a prescription to schedule a mammogram at the center of her choosing  5. Tobacco use disorder Options discussed Call for Wellbutrin  prescription if she decides to quit again   Halina Maidens, MD [facility]  07/27/2015

## 2015-10-29 ENCOUNTER — Ambulatory Visit
Admission: RE | Admit: 2015-10-29 | Discharge: 2015-10-29 | Disposition: A | Discharge status: Home / Self Care | Payer: Managed Care, Other (non HMO) | Source: Ambulatory Visit | Attending: Internal Medicine | Admitting: Internal Medicine

## 2015-10-29 ENCOUNTER — Ambulatory Visit (INDEPENDENT_AMBULATORY_CARE_PROVIDER_SITE_OTHER): Payer: Managed Care, Other (non HMO) | Admitting: Internal Medicine

## 2015-10-29 ENCOUNTER — Encounter: Payer: Self-pay | Admitting: Internal Medicine

## 2015-10-29 VITALS — BP 160/98 | HR 76 | Ht 66.0 in | Wt 145.0 lb

## 2015-10-29 DIAGNOSIS — R079 Chest pain, unspecified: Secondary | ICD-10-CM | POA: Insufficient documentation

## 2015-10-29 MED ORDER — NAPROXEN 375 MG PO TBEC: 1.0000 | DELAYED_RELEASE_TABLET | Freq: Two times a day (BID) | ORAL | Status: DC

## 2015-10-29 NOTE — Progress Notes (Signed)
    Date:  10/29/2015   Name:  Yvette Barr   DOB:  July 02, 1961   MRN:  [id number]   Chief Complaint: Chest Pain Chest Pain  This is a new problem. The current episode started today. The onset quality is sudden. The problem occurs constantly. The problem has been unchanged. The pain is present in the substernal region. The pain is moderate. The quality of the pain is described as heavy and tightness. The pain does not radiate. Associated symptoms include diaphoresis. Pertinent negatives include no abdominal pain, dizziness, exertional chest pressure, shortness of breath or sputum production. The pain is aggravated by breathing, movement and lifting arm. She has tried nothing for the symptoms.     Review of Systems  Constitutional: Positive for diaphoresis.  Respiratory: Negative for sputum production and shortness of breath.   Cardiovascular: Positive for chest pain.  Gastrointestinal: Negative for abdominal pain.  Neurological: Negative for dizziness.    Patient Active Problem List   Diagnosis Date Noted  . Constipation 07/26/2015  . Tobacco use disorder 07/26/2015  . Insomnia 07/26/2015  . Underweight 07/26/2015  . Ischemic colitis ([place]) 11/07/2014    Prior to Admission medications   Medication Sig Start Date End Date Taking? Authorizing Provider  albuterol (PROVENTIL HFA;VENTOLIN HFA) 108 (90 BASE) MCG/ACT inhaler Inhale 2 puffs into the lungs every 6 (six) hours as needed for wheezing or shortness of breath. 05/29/15  Yes Juline Patch, MD    Allergies  Allergen Reactions  . Codeine   . Morphine And Related     Past Surgical History  Procedure Laterality Date  . No past surgeries      Social History  Substance Use Topics  . Smoking status: Current Every Day Smoker  . Smokeless tobacco: None  . Alcohol Use: 0.0 oz/week    0 Standard drinks or equivalent per week    Medication list has been reviewed and updated.  Physical Exam  Constitutional: She appears  well-developed and well-nourished. She appears distressed (anxious).  HENT:  Right Ear: Tympanic membrane and ear canal normal.  Left Ear: Tympanic membrane and ear canal normal.  Nose: Nose normal. Right sinus exhibits no maxillary sinus tenderness and no frontal sinus tenderness. Left sinus exhibits no maxillary sinus tenderness and no frontal sinus tenderness.  Neck: No JVD present. Carotid bruit is not present. No thyromegaly present.  Cardiovascular: Normal rate, regular rhythm and normal heart sounds.   Pulmonary/Chest: Breath sounds normal. No accessory muscle usage. No respiratory distress.  Abdominal: Soft. Normal appearance. There is no tenderness.  Lymphadenopathy:    She has no cervical adenopathy.    She has no axillary adenopathy.  Skin: Skin is warm and dry.  Psychiatric: Her speech is normal. Her mood appears anxious.    BP 160/98 mmHg  Pulse 76  Ht 5\' 6"  (1.676 m)  Wt 145 lb (65.772 kg)  BMI 23.41 kg/m2  SpO2 100%  Assessment and Plan: 1. Chest pain, unspecified chest pain type Suspect costochondritis but with smoking history, will need CXR EKG normal Instructed to go to the ER if symptoms worsen - EKG 12-Lead - DG Chest 2 View; Future - Naproxen 375 MG TBEC; Take 1 tablet (375 mg total) by mouth 2 (two) times daily.  Dispense: 60 each; Refill: 0   Halina Maidens, MD [facility]  10/29/2015

## 2015-10-29 NOTE — Patient Instructions (Signed)

## 2015-10-30 ENCOUNTER — Telehealth: Payer: Self-pay

## 2015-10-30 NOTE — Telephone Encounter (Signed)
-----   Message from Glean Hess, MD sent at 10/29/2015  4:52 PM EST ----- CXR is normal.

## 2015-10-30 NOTE — Telephone Encounter (Signed)
Spoke with patient. Patient advised of all results and verbalized understanding. Will call back with any future questions or concerns. MAH  

## 2015-10-30 NOTE — Telephone Encounter (Signed)
Left message for patient to call back  

## 2016-01-25 ENCOUNTER — Ambulatory Visit (INDEPENDENT_AMBULATORY_CARE_PROVIDER_SITE_OTHER): Payer: Managed Care, Other (non HMO) | Admitting: Internal Medicine

## 2016-01-25 ENCOUNTER — Encounter: Payer: Self-pay | Admitting: Internal Medicine

## 2016-01-25 VITALS — BP 112/70 | HR 76 | Ht 66.0 in | Wt 144.8 lb

## 2016-01-25 DIAGNOSIS — Z0000 Encounter for general adult medical examination without abnormal findings: Secondary | ICD-10-CM

## 2016-01-25 DIAGNOSIS — Z1159 Encounter for screening for other viral diseases: Secondary | ICD-10-CM | POA: Diagnosis not present

## 2016-01-25 DIAGNOSIS — Z1239 Encounter for other screening for malignant neoplasm of breast: Secondary | ICD-10-CM | POA: Diagnosis not present

## 2016-01-25 DIAGNOSIS — Z1211 Encounter for screening for malignant neoplasm of colon: Secondary | ICD-10-CM | POA: Diagnosis not present

## 2016-01-25 DIAGNOSIS — K5901 Slow transit constipation: Secondary | ICD-10-CM

## 2016-01-25 DIAGNOSIS — Z124 Encounter for screening for malignant neoplasm of cervix: Secondary | ICD-10-CM | POA: Diagnosis not present

## 2016-01-25 LAB — POCT URINALYSIS DIPSTICK
BILIRUBIN UA: NEGATIVE
GLUCOSE UA: NEGATIVE
KETONES UA: NEGATIVE
Leukocytes, UA: NEGATIVE
Nitrite, UA: NEGATIVE
Protein, UA: NEGATIVE
RBC UA: NEGATIVE
SPEC GRAV UA: 1.025
UROBILINOGEN UA: 0.2
pH, UA: 7

## 2016-01-25 MED ORDER — LINACLOTIDE 290 MCG PO CAPS: 290.0000 ug | ORAL_CAPSULE | Freq: Every day | ORAL | Status: DC

## 2016-01-25 NOTE — Patient Instructions (Signed)
Breast Self-Awareness Practicing breast self-awareness may pick up problems early, prevent significant medical complications, and possibly save your life. By practicing breast self-awareness, you can become familiar with how your breasts look and feel and if your breasts are changing. This allows you to notice changes early. It can also offer you some reassurance that your breast health is good. One way to learn what is normal for your breasts and whether your breasts are changing is to do a breast self-exam. If you find a lump or something that was not present in the past, it is best to contact your caregiver right away. Other findings that should be evaluated by your caregiver include nipple discharge, especially if it is bloody; skin changes or reddening; areas where the skin seems to be pulled in (retracted); or new lumps and bumps. Breast pain is seldom associated with cancer (malignancy), but should also be evaluated by a caregiver. HOW TO PERFORM A BREAST SELF-EXAM The best time to examine your breasts is 5-7 days after your menstrual period is over. During menstruation, the breasts are lumpier, and it may be more difficult to pick up changes. If you do not menstruate, have reached menopause, or had your uterus removed (hysterectomy), you should examine your breasts at regular intervals, such as monthly. If you are breastfeeding, examine your breasts after a feeding or after using a breast pump. Breast implants do not decrease the risk for lumps or tumors, so continue to perform breast self-exams as recommended. Talk to your caregiver about how to determine the difference between the implant and breast tissue. Also, talk about the amount of pressure you should use during the exam. Over time, you will become more familiar with the variations of your breasts and more comfortable with the exam. A breast self-exam requires you to remove all your clothes above the waist. 1. Look at your breasts and nipples.  Stand in front of a mirror in a room with good lighting. With your hands on your hips, push your hands firmly downward. Look for a difference in shape, contour, and size from one breast to the other (asymmetry). Asymmetry includes puckers, dips, or bumps. Also, look for skin changes, such as reddened or scaly areas on the breasts. Look for nipple changes, such as discharge, dimpling, repositioning, or redness. 2. Carefully feel your breasts. This is best done either in the shower or tub while using soapy water or when flat on your back. Place the arm (on the side of the breast you are examining) above your head. Use the pads (not the fingertips) of your three middle fingers on your opposite hand to feel your breasts. Start in the underarm area and use  inch (2 cm) overlapping circles to feel your breast. Use 3 different levels of pressure (light, medium, and firm pressure) at each circle before moving to the next circle. The light pressure is needed to feel the tissue closest to the skin. The medium pressure will help to feel breast tissue a little deeper, while the firm pressure is needed to feel the tissue close to the ribs. Continue the overlapping circles, moving downward over the breast until you feel your ribs below your breast. Then, move one finger-width towards the center of the body. Continue to use the  inch (2 cm) overlapping circles to feel your breast as you move slowly up toward the collar bone (clavicle) near the base of the neck. Continue the up and down exam using all 3 pressures until you reach the   middle of the chest. Do this with each breast, carefully feeling for lumps or changes. 3.  Keep a written record with breast changes or normal findings for each breast. By writing this information down, you do not need to depend only on memory for size, tenderness, or location. Write down where you are in your menstrual cycle, if you are still menstruating. Breast tissue can have some lumps or  thick tissue. However, see your caregiver if you find anything that concerns you.  SEEK MEDICAL CARE IF:  You see a change in shape, contour, or size of your breasts or nipples.   You see skin changes, such as reddened or scaly areas on the breasts or nipples.   You have an unusual discharge from your nipples.   You feel a new lump or unusually thick areas.    This information is not intended to replace advice given to you by your health care provider. Make sure you discuss any questions you have with your health care provider.   Document Released: 09/15/2005 Document Revised: 09/01/2012 Document Reviewed: 12/31/2011 Elsevier Interactive Patient Education 2016 [employer]  

## 2016-01-25 NOTE — Progress Notes (Signed)
Date:  01/25/2016   Name:  NIKIAH DOMINSKI   DOB:  1961-07-19   MRN:  [id number]   Chief Complaint: Annual Exam NYEMA BRINCK is a 55 y.o. female who presents today for her Complete Annual Exam. She feels well. She reports exercising regularly. She reports she is sleeping well. She is due for mammogram and colonoscopy.  Her last Pap smear was in 2014.  Constipation This is a chronic problem. The problem is unchanged. Her stool frequency is 1 time per day. The stool is described as pellet like and scybalous. The patient is on a high fiber diet. She exercises regularly. There has been adequate water intake. Pertinent negatives include no abdominal pain, difficulty urinating, fever or vomiting.  Amitiza has not helped.  She would like to try Linzess.  She was supposed to have a colonoscopy last spring but she never received a call back from [facility].   Review of Systems  Constitutional: Negative for fever, chills and fatigue.  HENT: Negative for hearing loss, tinnitus, trouble swallowing and voice change.   Eyes: Negative for visual disturbance.  Respiratory: Negative for cough, chest tightness, shortness of breath and wheezing.   Gastrointestinal: Positive for constipation. Negative for vomiting, abdominal pain and blood in stool.  Genitourinary: Negative for vaginal bleeding, vaginal discharge, difficulty urinating, pelvic pain and dyspareunia.  Musculoskeletal: Negative for joint swelling, arthralgias and gait problem.  Skin: Negative for rash.  Allergic/Immunologic: Negative for environmental allergies.  Neurological: Negative for dizziness, seizures and headaches.  Hematological: Negative for adenopathy. Does not bruise/bleed easily.  Psychiatric/Behavioral: Negative for suicidal ideas. The patient is not nervous/anxious.     Patient Active Problem List   Diagnosis Date Noted  . Constipation 07/26/2015  . Tobacco use disorder 07/26/2015  . Insomnia 07/26/2015  . Underweight 07/26/2015    . Ischemic colitis ([place]) 11/07/2014    Prior to Admission medications   Medication Sig Start Date End Date Taking? Authorizing Provider  albuterol (PROVENTIL HFA;VENTOLIN HFA) 108 (90 BASE) MCG/ACT inhaler Inhale 2 puffs into the lungs every 6 (six) hours as needed for wheezing or shortness of breath. 05/29/15  Yes Juline Patch, MD    Allergies  Allergen Reactions  . Codeine   . Morphine And Related     Past Surgical History  Procedure Laterality Date  . No past surgeries      Social History  Substance Use Topics  . Smoking status: Current Every Day Smoker  . Smokeless tobacco: None  . Alcohol Use: 0.0 oz/week    0 Standard drinks or equivalent per week     Comment: occasional    Medication list has been reviewed and updated.   Physical Exam  Constitutional: She is oriented to person, place, and time. She appears well-developed and well-nourished. No distress.  HENT:  Head: Normocephalic and atraumatic.  Right Ear: Tympanic membrane and ear canal normal.  Left Ear: Tympanic membrane and ear canal normal.  Nose: Right sinus exhibits no maxillary sinus tenderness. Left sinus exhibits no maxillary sinus tenderness.  Mouth/Throat: Uvula is midline and oropharynx is clear and moist.  Eyes: Conjunctivae and EOM are normal. Right eye exhibits no discharge. Left eye exhibits no discharge. No scleral icterus.  Neck: Normal range of motion. Carotid bruit is not present. No erythema present. No thyromegaly present.  Cardiovascular: Normal rate, regular rhythm, normal heart sounds and normal pulses.   Pulmonary/Chest: Effort normal. No respiratory distress. She has no wheezes. Right breast exhibits inverted  nipple. Right breast exhibits no mass, no nipple discharge, no skin change and no tenderness. Left breast exhibits inverted nipple. Left breast exhibits no mass, no nipple discharge, no skin change and no tenderness.  Abdominal: Soft. Bowel sounds are normal. There is no  hepatosplenomegaly. There is no tenderness. There is no CVA tenderness.  Genitourinary: Vagina normal and uterus normal. There is no rash, tenderness or lesion on the right labia. There is no rash, tenderness or lesion on the left labia. Cervix exhibits no motion tenderness, no discharge and no friability. Right adnexum displays no mass, no tenderness and no fullness. Left adnexum displays no mass, no tenderness and no fullness.  Lymphadenopathy:    She has no cervical adenopathy.    She has no axillary adenopathy.  Neurological: She is alert and oriented to person, place, and time. She has normal reflexes. No cranial nerve deficit or sensory deficit.  Skin: Skin is warm, dry and intact. No rash noted.  Psychiatric: She has a normal mood and affect. Her speech is normal and behavior is normal. Thought content normal.  Nursing note and vitals reviewed.   BP 112/70 mmHg  Pulse 76  Ht 5\' 6"  (1.676 m)  Wt 144 lb 12.8 oz (65.681 kg)  BMI 23.38 kg/m2  Assessment and Plan: 1. Annual physical exam She continues to smoke but is working on cutting back  CBC with Differential/Platelet - Comprehensive metabolic panel - Lipid panel - TSH - POCT urinalysis dipstick  2. Breast cancer screening Schedule mammogram at [facility]  3. Colon cancer screening Refer to [facility] - Ambulatory referral to Gastroenterology  4. Need for hepatitis C screening test - Hepatitis C antibody  5. Slow transit constipation Stop amitiza - linaclotide (LINZESS) 290 MCG CAPS capsule; Take 1 capsule (290 mcg total) by mouth daily before breakfast.  Dispense: 30 capsule; Refill: 5  6. Encounter for screening for cervical cancer  Normal exam - repeat in 5 years if normal - Pap IG and HPV (high risk) DNA detection   Halina Maidens, MD [facility]  01/25/2016

## 2016-01-26 LAB — COMPREHENSIVE METABOLIC PANEL
A/G RATIO: 1.9 (ref 1.2–2.2)
ALT: 17 IU/L (ref 0–32)
AST: 24 IU/L (ref 0–40)
Albumin: 4 g/dL (ref 3.5–5.5)
Alkaline Phosphatase: 56 IU/L (ref 39–117)
BUN/Creatinine Ratio: 31 — ABNORMAL HIGH (ref 9–23)
BUN: 28 mg/dL — AB (ref 6–24)
Bilirubin Total: <0.2 mg/dL (ref 0.0–1.2)
CALCIUM: 8.9 mg/dL (ref 8.7–10.2)
CO2: 23 mmol/L (ref 18–29)
CREATININE: 0.9 mg/dL (ref 0.57–1.00)
Chloride: 103 mmol/L (ref 96–106)
GFR, EST AFRICAN AMERICAN: 85 mL/min/{1.73_m2} (ref >59)
GFR, EST NON AFRICAN AMERICAN: 74 mL/min/{1.73_m2} (ref >59)
Globulin, Total: 2.1 g/dL (ref 1.5–4.5)
Glucose: 76 mg/dL (ref 65–99)
POTASSIUM: 4.7 mmol/L (ref 3.5–5.2)
Sodium: 139 mmol/L (ref 134–144)
TOTAL PROTEIN: 6.1 g/dL (ref 6.0–8.5)

## 2016-01-26 LAB — CBC WITH DIFFERENTIAL/PLATELET
BASOS: 1 %
Basophils Absolute: 0.1 10*3/uL (ref 0.0–0.2)
EOS (ABSOLUTE): 0.3 10*3/uL (ref 0.0–0.4)
EOS: 4 %
HEMATOCRIT: 38.4 % (ref 34.0–46.6)
HEMOGLOBIN: 12.7 g/dL (ref 11.1–15.9)
Immature Grans (Abs): 0 10*3/uL (ref 0.0–0.1)
Immature Granulocytes: 0 %
LYMPHS ABS: 2.2 10*3/uL (ref 0.7–3.1)
Lymphs: 28 %
MCH: 31.6 pg (ref 26.6–33.0)
MCHC: 33.1 g/dL (ref 31.5–35.7)
MCV: 96 fL (ref 79–97)
MONOCYTES: 6 %
Monocytes Absolute: 0.5 10*3/uL (ref 0.1–0.9)
NEUTROS ABS: 4.8 10*3/uL (ref 1.4–7.0)
Neutrophils: 61 %
Platelets: 229 10*3/uL (ref 150–379)
RBC: 4.02 x10E6/uL (ref 3.77–5.28)
RDW: 14.4 % (ref 12.3–15.4)
WBC: 7.9 10*3/uL (ref 3.4–10.8)

## 2016-01-26 LAB — HEPATITIS C ANTIBODY: Hep C Virus Ab: <0.1 s/co ratio (ref 0.0–0.9)

## 2016-01-26 LAB — LIPID PANEL
CHOL/HDL RATIO: 2.3 ratio (ref 0.0–4.4)
Cholesterol, Total: 139 mg/dL (ref 100–199)
HDL: 61 mg/dL (ref >39)
LDL CALC: 69 mg/dL (ref 0–99)
Triglycerides: 44 mg/dL (ref 0–149)
VLDL CHOLESTEROL CAL: 9 mg/dL (ref 5–40)

## 2016-01-26 LAB — TSH: TSH: 1.74 u[IU]/mL (ref 0.450–4.500)

## 2016-01-29 LAB — PAP IG AND HPV HIGH-RISK
HPV, high-risk: POSITIVE — AB
PAP Smear Comment: 0

## 2017-01-09 ENCOUNTER — Encounter: Payer: Self-pay | Admitting: Emergency Medicine

## 2017-01-09 ENCOUNTER — Emergency Department: Payer: Managed Care, Other (non HMO)

## 2017-01-09 ENCOUNTER — Emergency Department
Admission: EM | Admit: 2017-01-09 | Discharge: 2017-01-09 | Disposition: A | Discharge status: Home / Self Care | Payer: Managed Care, Other (non HMO) | Attending: Emergency Medicine | Admitting: Emergency Medicine

## 2017-01-09 DIAGNOSIS — I83813 Varicose veins of bilateral lower extremities with pain: Secondary | ICD-10-CM | POA: Diagnosis not present

## 2017-01-09 DIAGNOSIS — R52 Pain, unspecified: Secondary | ICD-10-CM

## 2017-01-09 DIAGNOSIS — I739 Peripheral vascular disease, unspecified: Secondary | ICD-10-CM

## 2017-01-09 DIAGNOSIS — M79604 Pain in right leg: Secondary | ICD-10-CM | POA: Diagnosis present

## 2017-01-09 DIAGNOSIS — I8393 Asymptomatic varicose veins of bilateral lower extremities: Secondary | ICD-10-CM

## 2017-01-09 DIAGNOSIS — Z79899 Other long term (current) drug therapy: Secondary | ICD-10-CM | POA: Diagnosis not present

## 2017-01-09 DIAGNOSIS — F17210 Nicotine dependence, cigarettes, uncomplicated: Secondary | ICD-10-CM | POA: Diagnosis not present

## 2017-01-09 MED ORDER — IBUPROFEN 800 MG PO TABS
800.0000 mg | ORAL_TABLET | Freq: Once | ORAL | Status: AC
  Administered 2017-01-09: 800 mg via ORAL
  Filled 2017-01-09: qty 1

## 2017-01-09 MED ORDER — IBUPROFEN 800 MG PO TABS: 800.0000 mg | ORAL_TABLET | Freq: Three times a day (TID) | ORAL | 0 refills | Status: DC | PRN

## 2017-01-09 NOTE — ED Notes (Signed)
Pt. Verbalizes understanding of d/c instructions and follow-up. VS stable and pain controlled per pt.  Pt. In NAD at time of d/c and denies further concerns regarding this visit. Pt. Stable at the time of departure from the unit, departing unit by the safest and most appropriate manner per that pt condition and limitations. Pt advised to return to the ED at any time for emergent concerns, or for new/worsening symptoms.   

## 2017-01-09 NOTE — ED Provider Notes (Signed)
[facility] Emergency Department Provider Note  ____________________________________________  Time seen: Approximately 8:05 PM  I have reviewed the triage vital signs and the nursing notes.   HISTORY  Chief Complaint Leg Pain    HPI Yvette Barr is a 56 y.o. female with hyperlipidemia and ongoing tobacco abuse presenting w/ right medial thigh pain.  The patient reports that for the past several months she has had intermittent numbness of the toes and the right leg. She has also occasionally had pain in the right medial thigh, but this is been getting worse and was severe tonight. She notes that the pain is worse when she is at rest or when she is laying down at night and it is better if she hangs her leg off the bed. She does not have any associated trauma, fever, back pain. She does note more superficial varicosities on the right lower extremity than the left.   History reviewed. No pertinent past medical history.  Patient Active Problem List   Diagnosis Date Noted  . Constipation 07/26/2015  . Tobacco use disorder 07/26/2015  . Insomnia 07/26/2015  . Underweight 07/26/2015  . Ischemic colitis ([place]) 11/07/2014    Past Surgical History:  Procedure Laterality Date  . NO PAST SURGERIES      Current Outpatient Rx  . Order #: [id number] Class: Normal  . Order #: [id number] Class: Print  . Order #: [id number] Class: Normal    Allergies Codeine and Morphine and related  Family History  Problem Relation Age of Onset  . Hyperlipidemia Mother   . Hyperlipidemia Father     Social History Social History  Substance Use Topics  . Smoking status: Current Every Day Smoker    Packs/day: 0.25    Types: Cigarettes  . Smokeless tobacco: Never Used  . Alcohol use 0.0 oz/week     Comment: occasional    Review of Systems Constitutional: No fever/chills. Eyes: No visual changes. ENT: No sore throat. No congestion or rhinorrhea. Cardiovascular: Denies chest  pain. Denies palpitations. Respiratory: Denies shortness of breath.  No cough. Gastrointestinal: No abdominal pain.  No nausea, no vomiting.  No diarrhea.  No constipation. Genitourinary: Negative for dysuria. Musculoskeletal: Negative for back pain. Positive right lower extremity pain. Skin: Negative for rash. Neurological: Negative for headaches. No focal , tingling or weakness. Positive right toe numbness  10-point ROS otherwise negative.  ____________________________________________   PHYSICAL EXAM:  VITAL SIGNS: ED Triage Vitals  Enc Vitals Group     BP 01/09/17 1727 (!) 152/89     Pulse Rate 01/09/17 1725 69     Resp 01/09/17 1725 16     Temp 01/09/17 1725 98.5 F (36.9 C)     Temp Source 01/09/17 1725 Oral     SpO2 01/09/17 1725 100 %     Weight 01/09/17 1725 140 lb (63.5 kg)     Height 01/09/17 1725 5\' 6"  (1.676 m)     Head Circumference --      Peak Flow --      Pain Score 01/09/17 1725 8     Pain Loc --      Pain Edu? --      Excl. in [place]? --     Constitutional: Alert and oriented. Well appearing and in no acute distress. Answers questions appropriately. Eyes: Conjunctivae are normal.  EOMI. No scleral icterus. Head: Atraumatic. Nose: No congestion/rhinnorhea. Mouth/Throat: Mucous membranes are moist.  Neck: No stridor.  Supple.   Cardiovascular: Normal rate Respiratory: Prolonged expiratory phase.  Gastrointestinal: Soft, nontender and nondistended.  No guarding or rebound.  No peritoneal signs. Musculoskeletal: No lower extremity edema. The patient has a large number of superficial varicosities. No erythema or skin disruption. Normal DP pulses bilaterally and more faint PT pulses bilaterally. Normal dorsiflexion and plantar flexion bilaterally. The patient's pain in the right medial thigh is not reproduced with palpation. She has full range of motion of the right ankle, knee, hip without pain. Normal sensation to light touch except in the distal toes on the right  side. Neurologic:  A&Ox3.  Speech is clear.  Face and smile are symmetric.  EOMI.  Moves all extremities well. Skin:  Skin is warm, dry and intact. No rash noted. Psychiatric: Mood and affect are normal. Speech and behavior are normal.  Normal judgement.  ____________________________________________   LABS (all labs ordered are listed, but only abnormal results are displayed)  Labs Reviewed - No data to display ____________________________________________  EKG  Not indicated ____________________________________________  RADIOLOGY  US Venous Img Lower Unilateral Right  Result Date: 01/09/2017 CLINICAL DATA:  RIGHT lower extremity pain for 3 days. EXAM: RIGHT LOWER EXTREMITY VENOUS DOPPLER ULTRASOUND TECHNIQUE: Gray-scale sonography with graded compression, as well as color Doppler and duplex ultrasound were performed to evaluate the lower extremity deep venous systems from the level of the common femoral vein and including the common femoral, femoral, profunda femoral, popliteal and calf veins including the posterior tibial, peroneal and gastrocnemius veins when visible. The superficial great saphenous vein was also interrogated. Spectral Doppler was utilized to evaluate flow at rest and with distal augmentation maneuvers in the common femoral, femoral and popliteal veins. COMPARISON:  None. FINDINGS: Contralateral Common Femoral Vein: Respiratory phasicity is normal and symmetric with the symptomatic side. No evidence of thrombus. Normal compressibility. Common Femoral Vein: No evidence of thrombus. Normal compressibility, respiratory phasicity and response to augmentation. Profunda Femoral Vein: No evidence of thrombus. Normal compressibility and flow on color Doppler imaging. Femoral Vein: No evidence of thrombus. Normal compressibility, respiratory phasicity and response to augmentation. Popliteal Vein: No evidence of thrombus. Normal compressibility, respiratory phasicity and response to  augmentation. Calf Veins: No evidence of thrombus. Normal compressibility and flow on color Doppler imaging. Superficial Great Saphenous Vein: No evidence of thrombus. Normal compressibility and flow on color Doppler imaging. Venous Reflux:  None. Other Findings: Pulsatile spectral waveforms associated with RIGHT heart failure. IMPRESSION: No evidence of RIGHT lower extremity deep venous thrombosis. Pulsatile spectral waveforms seen with RIGHT heart failure, recommend chest radiograph. Electronically Signed   By: Elon Alas M.D.   On: 01/09/2017 18:33    ____________________________________________   PROCEDURES  Procedure(s) performed: None  Procedures  Critical Care performed: No ____________________________________________   INITIAL IMPRESSION / ASSESSMENT AND PLAN / ED COURSE  Pertinent labs & imaging results that were available during my care of the patient were reviewed by me and considered in my medical decision making (see chart for details).  56 y.o. female with tobacco abuse ongoing and hyperlipidemia presenting with intermittent right medial thigh pain as well as right toe numbness. The most likely etiology given the patient's history is claudication. She has an ultrasound which has ruled out DVT in the right lower extremity although I have counseled her about the small rate of false negatives and the need for repeat if her vascular workup is negative. I'll plan to discharge the patient home with follow-up instructions to make an appointment with Dr. Lucky Cowboy, the vascular surgeon on-call. She will be treated with Tylenol  or Motrin for pain. Return precautions were discussed  ____________________________________________  FINAL CLINICAL IMPRESSION(S) / ED DIAGNOSES  Final diagnoses:  Pain  Right leg claudication (HCC)  Varicose veins of both lower extremities         NEW MEDICATIONS STARTED DURING THIS VISIT:  New Prescriptions   IBUPROFEN (ADVIL,MOTRIN) 800 MG  TABLET    Take 1 tablet (800 mg total) by mouth every 8 (eight) hours as needed for mild pain, moderate pain or cramping (with food).      Eula Listen, MD 01/09/17 2016

## 2017-01-09 NOTE — ED Triage Notes (Signed)
Pt reports right leg "throbbing" pain x3 days.

## 2017-01-09 NOTE — Discharge Instructions (Signed)
Your symptoms sound most like claudication, and he can read about this and the attached paperwork. Please make an appointment with Dr. Lucky Cowboy, the vascular surgeon on-call.  In the meantime, please attempt to decrease your smoking. You may take Tylenol or Motrin for ear pain.  Return to the emergency department if you develop severe pain, numbness tingling or weakness, or any other symptoms concerning to you.

## 2017-01-13 ENCOUNTER — Ambulatory Visit (INDEPENDENT_AMBULATORY_CARE_PROVIDER_SITE_OTHER): Payer: Managed Care, Other (non HMO) | Admitting: Vascular Surgery

## 2017-01-13 ENCOUNTER — Encounter (INDEPENDENT_AMBULATORY_CARE_PROVIDER_SITE_OTHER): Payer: Self-pay | Admitting: Vascular Surgery

## 2017-01-13 VITALS — BP 132/80 | HR 67 | Resp 18 | Ht 66.0 in | Wt 156.0 lb

## 2017-01-13 DIAGNOSIS — I70211 Atherosclerosis of native arteries of extremities with intermittent claudication, right leg: Secondary | ICD-10-CM

## 2017-01-13 DIAGNOSIS — F17200 Nicotine dependence, unspecified, uncomplicated: Secondary | ICD-10-CM

## 2017-01-13 DIAGNOSIS — I70219 Atherosclerosis of native arteries of extremities with intermittent claudication, unspecified extremity: Secondary | ICD-10-CM | POA: Insufficient documentation

## 2017-01-13 NOTE — Assessment & Plan Note (Signed)
We had a discussion for approximately 4 minutes regarding the absolute need for smoking cessation due to the deleterious nature of tobacco on the vascular system. We discussed the tobacco use would diminish patency of any intervention, and likely significantly worsen progressio of disease. We discussed multiple agents for quitting including replacement therapy or medications to reduce cravings such as Chantix. The patient voices their understanding of the importance of smoking cessation.  

## 2017-01-13 NOTE — Progress Notes (Addendum)
[name], female   DOB: 1961/07/15, 56 y.o.   MRN: [id number]  Chief Complaint  Patient presents with  . New Patient (Initial Visit)    claudication    HPI Yvette Barr is a 56 y.o. female.  I am asked to see the patient by Dr. Mariea Clonts in the [facility] ED for evaluation of PAD with claudication of the right leg.  The patient reports many months of pain in her calf and foot with activity. Over the past week or 2, this is become much more severe and now she is unable to do much activity at all. The pain radiates up into the thigh where the pain is sometimes the worse. She does not have any left leg symptoms. The pain comes on very short distances of activity and activity as the major exacerbating feature. It is a cramping and aching discomfort in the leg becomes very weak and tired. She reports ongoing tobacco use. She denies fever or chills. She does not have ulceration or infection   No past medical history on file.  Past Surgical History:  Procedure Laterality Date  . NO PAST SURGERIES      Family History  Problem Relation Age of Onset  . Hyperlipidemia Mother   . Hyperlipidemia Father    No bleeding or clotting disorders  Social History Social History  Substance Use Topics  . Smoking status: Current Every Day Smoker    Packs/day: 0.25    Types: Cigarettes  . Smokeless tobacco: Never Used  . Alcohol use 0.0 oz/week     Comment: occasional   No IV drug use  Allergies  Allergen Reactions  . Codeine   . Morphine And Related     Current Outpatient Prescriptions  Medication Sig Dispense Refill  . albuterol (PROVENTIL HFA;VENTOLIN HFA) 108 (90 BASE) MCG/ACT inhaler Inhale 2 puffs into the lungs every 6 (six) hours as needed for wheezing or shortness of breath. 1 Inhaler 0  . ibuprofen (ADVIL,MOTRIN) 800 MG tablet Take 1 tablet (800 mg total) by mouth every 8 (eight) hours as needed for mild pain, moderate pain or cramping (with food). 20 tablet 0  .  linaclotide (LINZESS) 290 MCG CAPS capsule Take 1 capsule (290 mcg total) by mouth daily before breakfast. 30 capsule 5   No current facility-administered medications for this visit.       REVIEW OF SYSTEMS (Negative unless checked)  Constitutional: [] Weight loss  [] Fever  [] Chills Cardiac: [] Chest pain   [] Chest pressure   [] Palpitations   [] Shortness of breath when laying flat   [] Shortness of breath at rest   [x] Shortness of breath with exertion. Vascular:  [x] Pain in legs with walking   [x] Pain in legs at rest   [] Pain in legs when laying flat   [x] Claudication   [] Pain in feet when walking  [] Pain in feet at rest  [] Pain in feet when laying flat   [] History of DVT   [] Phlebitis   [] Swelling in legs   [] Varicose veins   [] Non-healing ulcers Pulmonary:   [] Uses home oxygen   [] Productive cough   [] Hemoptysis   [x] Wheeze  [x] COPD   [] Asthma Neurologic:  [] Dizziness  [] Blackouts   [] Seizures   [] History of stroke   [] History of TIA  [] Aphasia   [] Temporary blindness   [] Dysphagia   [] Weakness or numbness in arms   [] Weakness or numbness in legs Musculoskeletal:  [] Arthritis   [] Joint swelling   [] Joint pain   [] Low back  pain Hematologic:  [] Easy bruising  [] Easy bleeding   [] Hypercoagulable state   [] Anemic  [] Hepatitis Gastrointestinal:  [] Blood in stool   [] Vomiting blood  [] Gastroesophageal reflux/heartburn   [] Abdominal pain Genitourinary:  [] Chronic kidney disease   [] Difficult urination  [] Frequent urination  [] Burning with urination   [] Hematuria Skin:  [] Rashes   [] Ulcers   [] Wounds Psychological:  [] History of anxiety   []  History of major depression.    Physical Exam BP 132/80   Pulse 67   Resp 18   Ht 5\' 6"  (1.676 m)   Wt 156 lb (70.8 kg)   BMI 25.18 kg/m  Gen:  WD/WN, NAD Head: [place]/AT, No temporalis wasting.  Ear/Nose/Throat: Hearing grossly intact, nares w/o erythema or drainage, oropharynx w/o Erythema/Exudate Eyes: Conjunctiva clear, sclera non-icteric  Neck: trachea  midline.  No JVD.  Pulmonary:  Good air movement, respirations not labored, no use of accessory muscles Cardiac: RRR no JVD Vascular:  Vessel Right Left  Radial Palpable Palpable  Ulnar Palpable Palpable  Brachial Palpable Palpable  Carotid Palpable, without bruit Palpable, without bruit  Aorta Not palpable N/A  Femoral Palpable Palpable  Popliteal Not Palpable 1+ Palpable  PT Trace Palpable 1+ Palpable  DP 1+ Palpable Palpable   Gastrointestinal: soft, non-tender/non-distended.  Musculoskeletal: M/S 5/5 throughout.  Extremities without ischemic changes.  No deformity or atrophy. No edema. Neurologic: Sensation grossly intact in extremities.  Symmetrical.  Speech is fluent. Motor exam as listed above. Psychiatric: Judgment intact, Mood & affect appropriate for pt's clinical situation. Dermatologic: No rashes or ulcers noted.  No cellulitis or open wounds.    Radiology US Venous Img Lower Unilateral Right  Result Date: 01/09/2017 CLINICAL DATA:  RIGHT lower extremity pain for 3 days. EXAM: RIGHT LOWER EXTREMITY VENOUS DOPPLER ULTRASOUND TECHNIQUE: Gray-scale sonography with graded compression, as well as color Doppler and duplex ultrasound were performed to evaluate the lower extremity deep venous systems from the level of the common femoral vein and including the common femoral, femoral, profunda femoral, popliteal and calf veins including the posterior tibial, peroneal and gastrocnemius veins when visible. The superficial great saphenous vein was also interrogated. Spectral Doppler was utilized to evaluate flow at rest and with distal augmentation maneuvers in the common femoral, femoral and popliteal veins. COMPARISON:  None. FINDINGS: Contralateral Common Femoral Vein: Respiratory phasicity is normal and symmetric with the symptomatic side. No evidence of thrombus. Normal compressibility. Common Femoral Vein: No evidence of thrombus. Normal compressibility, respiratory phasicity and  response to augmentation. Profunda Femoral Vein: No evidence of thrombus. Normal compressibility and flow on color Doppler imaging. Femoral Vein: No evidence of thrombus. Normal compressibility, respiratory phasicity and response to augmentation. Popliteal Vein: No evidence of thrombus. Normal compressibility, respiratory phasicity and response to augmentation. Calf Veins: No evidence of thrombus. Normal compressibility and flow on color Doppler imaging. Superficial Great Saphenous Vein: No evidence of thrombus. Normal compressibility and flow on color Doppler imaging. Venous Reflux:  None. Other Findings: Pulsatile spectral waveforms associated with RIGHT heart failure. IMPRESSION: No evidence of RIGHT lower extremity deep venous thrombosis. Pulsatile spectral waveforms seen with RIGHT heart failure, recommend chest radiograph. Electronically Signed   By: Elon Alas M.D.   On: 01/09/2017 18:33    Labs No results found for this or any previous visit (from the past 2160 hour(s)).  Assessment/Plan:  Tobacco use disorder We had a discussion for approximately 4 minutes regarding the absolute need for smoking cessation due to the deleterious nature of tobacco on the vascular  system. We discussed the tobacco use would diminish patency of any intervention, and likely significantly worsen progressio of disease. We discussed multiple agents for quitting including replacement therapy or medications to reduce cravings such as Chantix. The patient voices their understanding of the importance of smoking cessation.   Atherosclerosis of native arteries of extremity with intermittent claudication (HCC) Recommend:  Patient should undergo arterial duplex of the lower extremity ASAP because there has been a significant deterioration in the patient's lower extremity symptoms.  The patient states they are having increased pain and a marked decrease in the distance that they can walk.  The risks and benefits as  well as the alternatives were discussed in detail with the patient.  All questions were answered.  Patient agrees to proceed and understands this could be a prelude to angiography and intervention.  The patient will follow up with me in the office to review the studies.  We discussed the pathophysiology and natural history of peripheral arterial disease. We also discussed other possible causes including neuropathic pain or back problem is causing neurogenic claudication.      Leotis Pain 01/13/2017, 11:55 AM   This note was created with Dragon medical transcription system.  Any errors from dictation are unintentional.

## 2017-01-13 NOTE — Patient Instructions (Signed)

## 2017-01-13 NOTE — Assessment & Plan Note (Signed)
Recommend:  Patient should undergo arterial duplex of the lower extremity ASAP because there has been a significant deterioration in the patient's lower extremity symptoms.  The patient states they are having increased pain and a marked decrease in the distance that they can walk.  The risks and benefits as well as the alternatives were discussed in detail with the patient.  All questions were answered.  Patient agrees to proceed and understands this could be a prelude to angiography and intervention.  The patient will follow up with me in the office to review the studies.  We discussed the pathophysiology and natural history of peripheral arterial disease. We also discussed other possible causes including neuropathic pain or back problem is causing neurogenic claudication.

## 2017-01-14 ENCOUNTER — Encounter (INDEPENDENT_AMBULATORY_CARE_PROVIDER_SITE_OTHER): Payer: Self-pay | Admitting: Vascular Surgery

## 2017-01-14 ENCOUNTER — Ambulatory Visit (INDEPENDENT_AMBULATORY_CARE_PROVIDER_SITE_OTHER): Payer: Managed Care, Other (non HMO) | Admitting: Vascular Surgery

## 2017-01-14 ENCOUNTER — Ambulatory Visit (INDEPENDENT_AMBULATORY_CARE_PROVIDER_SITE_OTHER): Payer: Managed Care, Other (non HMO)

## 2017-01-14 VITALS — BP 148/88 | HR 61 | Resp 16 | Ht 66.0 in | Wt 156.0 lb

## 2017-01-14 DIAGNOSIS — I70211 Atherosclerosis of native arteries of extremities with intermittent claudication, right leg: Secondary | ICD-10-CM

## 2017-01-14 DIAGNOSIS — F17200 Nicotine dependence, unspecified, uncomplicated: Secondary | ICD-10-CM

## 2017-01-14 NOTE — Progress Notes (Signed)
Subjective:    [name], female    DOB: [birth year], 56 y.o.   MRN: [id number] Chief Complaint  Patient presents with  . Re-evaluation    Ultrasound follow up   Patient last seen on 01/13/17 after being seen in [facility] ED on 01/09/17 for right lower extremity pain and numbness. Symptoms are stable. Patient presents today to review vascular studies. The patient underwent an ABI which showed Right ABI: 1.20 and Left 1.28 (good waveforms noted). Denies rest pain or ulceration to the lower extremity.    Review of Systems  Constitutional: Negative.   HENT: Negative.   Eyes: Negative.   Respiratory: Negative.   Cardiovascular:       Right lower extremity pain  Gastrointestinal: Negative.   Endocrine: Negative.   Genitourinary: Negative.   Musculoskeletal: Negative.   Skin: Negative.   Allergic/Immunologic: Negative.   Neurological: Negative.   Hematological: Negative.   Psychiatric/Behavioral: Negative.       Objective:   Physical Exam  Vitals reviewed.  Please note physical exam is the same as yesterday.  BP (!) 148/88 (BP Location: Right Arm)   Pulse 61   Resp 16   Ht 5\' 6"  (1.676 m)   Wt 156 lb (70.8 kg)   BMI 25.18 kg/m    Gen:  WD/WN, NAD Head: [place]/AT, No temporalis wasting.  Ear/Nose/Throat: Hearing grossly intact, nares w/o erythema or drainage, oropharynx w/o Erythema/Exudate Eyes: Conjunctiva clear, sclera non-icteric  Neck: trachea midline.  No JVD.  Pulmonary:  Good air movement, respirations not labored, no use of accessory muscles Cardiac: RRR no JVD Vascular:  Vessel Right Left  Radial Palpable Palpable  Ulnar Palpable Palpable  Brachial Palpable Palpable  Carotid Palpable, without bruit Palpable, without bruit  Aorta Not palpable N/A  Femoral Palpable Palpable  Popliteal Not Palpable 1+ Palpable  PT Trace Palpable 1+ Palpable  DP 1+ Palpable Palpable   Gastrointestinal: soft, non-tender/non-distended.  Musculoskeletal: M/S 5/5  throughout.  Extremities without ischemic changes.  No deformity or atrophy. No edema. Neurologic: Sensation grossly intact in extremities.  Symmetrical.  Speech is fluent. Motor exam as listed above. Psychiatric: Judgment intact, Mood & affect appropriate for pt's clinical situation. Dermatologic: No rashes or ulcers noted.  No cellulitis or open wounds.  No past medical history on file.  Social History   Social History  . Marital status: Married    Spouse name: N/A  . Number of children: N/A  . Years of education: N/A   Occupational History  . Not on file.   Social History Main Topics  . Smoking status: Current Every Day Smoker    Packs/day: 0.25    Types: Cigarettes  . Smokeless tobacco: Never Used  . Alcohol use 0.0 oz/week     Comment: occasional  . Drug use: No  . Sexual activity: Yes   Other Topics Concern  . Not on file   Social History Narrative  . No narrative on file   Past Surgical History:  Procedure Laterality Date  . NO PAST SURGERIES     Family History  Problem Relation Age of Onset  . Hyperlipidemia Mother   . Hyperlipidemia Father    Allergies  Allergen Reactions  . Codeine   . Morphine And Related       Assessment & Plan:  Patient last seen on 01/13/17 after being seen in [facility] ED on 01/09/17 for right lower extremity pain and numbness. Symptoms are stable. Patient presents today to review vascular  studies. The patient underwent an ABI which showed Right ABI: 1.20 and Left 1.28 (good waveforms noted). Denies rest pain or ulceration to the lower extremity.   1. Atherosclerosis of native artery of right lower extremity with intermittent claudication ([place]) - Stable Patient with right lower extremity lifestylr limiting pain and numbess ABI's normal with good waveforms. Believe DJD and neuropathy contributing to lower extremity discomfort. Patient to follow up with neurosurgery for workup. Follow up with [place] PRN.  - Ambulatory referral to  Neurosurgery  2. Tobacco use disorder - Stable I have discussed (approximately 5 minutes) with the patient the role of tobacco in the pathogenesis of atherosclerosis and its effect on the progression of the disease, impact on the durability of interventions and its limitations on the formation of collateral pathways. I have recommended absolute tobacco cessation. I have discussed various options available for assistance with tobacco cessation including over the counter methods (Nicotine gum, patch and lozenges). We also discussed prescription options (Chantix, Nicotine Inhaler / Nasal Spray). The patient is not interested in pursuing any prescription tobacco cessation options at this time. The patient voices their understanding.   Current Outpatient Prescriptions on File Prior to Visit  Medication Sig Dispense Refill  . albuterol (PROVENTIL HFA;VENTOLIN HFA) 108 (90 BASE) MCG/ACT inhaler Inhale 2 puffs into the lungs every 6 (six) hours as needed for wheezing or shortness of breath. 1 Inhaler 0  . ibuprofen (ADVIL,MOTRIN) 800 MG tablet Take 1 tablet (800 mg total) by mouth every 8 (eight) hours as needed for mild pain, moderate pain or cramping (with food). 20 tablet 0  . linaclotide (LINZESS) 290 MCG CAPS capsule Take 1 capsule (290 mcg total) by mouth daily before breakfast. 30 capsule 5   No current facility-administered medications on file prior to visit.    There are no Patient Instructions on file for this visit. No Follow-up on file.  Yvette Barr A Verlisa Vara, PA-C

## 2017-01-30 ENCOUNTER — Encounter: Payer: Managed Care, Other (non HMO) | Admitting: Internal Medicine

## 2017-06-29 ENCOUNTER — Ambulatory Visit (INDEPENDENT_AMBULATORY_CARE_PROVIDER_SITE_OTHER): Payer: Managed Care, Other (non HMO) | Admitting: Podiatry

## 2017-06-29 ENCOUNTER — Encounter: Payer: Self-pay | Admitting: Podiatry

## 2017-06-29 VITALS — BP 133/68 | HR 60 | Resp 16

## 2017-06-29 DIAGNOSIS — L600 Ingrowing nail: Secondary | ICD-10-CM | POA: Diagnosis not present

## 2017-06-29 MED ORDER — NEOMYCIN-POLYMYXIN-HC 1 % OT SOLN: OTIC | 1 refills | Status: DC

## 2017-06-29 NOTE — Patient Instructions (Signed)

## 2017-06-29 NOTE — Progress Notes (Signed)
  Subjective:  [name], female    DOB: April 05, 1961,  MRN: [id number] HPI Chief Complaint  Patient presents with  . Toe Pain    Hallux right - medial border, few months, intermittent pain, had nail procedure done on same border years ago with Dr. Milinda Pointer    56 y.o. female presents with the above complaint.   Ingrown toenail tibial border hallux right.  No past medical history on file. Past Surgical History:  Procedure Laterality Date  . NO PAST SURGERIES      Current Outpatient Prescriptions:  .  phentermine (ADIPEX-P) 37.5 MG tablet, Take by mouth., Disp: , Rfl:  .  albuterol (PROVENTIL HFA;VENTOLIN HFA) 108 (90 BASE) MCG/ACT inhaler, Inhale 2 puffs into the lungs every 6 (six) hours as needed for wheezing or shortness of breath., Disp: 1 Inhaler, Rfl: 0 .  ibuprofen (ADVIL,MOTRIN) 800 MG tablet, Take 1 tablet (800 mg total) by mouth every 8 (eight) hours as needed for mild pain, moderate pain or cramping (with food)., Disp: 20 tablet, Rfl: 0 .  linaclotide (LINZESS) 290 MCG CAPS capsule, Take 1 capsule (290 mcg total) by mouth daily before breakfast., Disp: 30 capsule, Rfl: 5  Allergies  Allergen Reactions  . Codeine   . Morphine And Related    Review of Systems  All other systems reviewed and are negative.  Objective:   Vitals:   06/29/17 1046  BP: 133/68  Pulse: 60  Resp: 16    General: Well developed, nourished, in no acute distress, alert and oriented x3   Dermatological: Skin is warm, dry and supple bilateral. Nails x 10 are well maintained; remaining integument appears unremarkable at this time. There are no open sores, no preulcerative lesions, no rash or signs of infection present.Sharp incurvated nail margin along the tibial border hallux right painful on palpation. No open lesions or wounds. No P Olson no malodor. Mild erythema is noted.  Vascular: Dorsalis Pedis artery and Posterior Tibial artery pedal pulses are 2/4 bilateral with immedate  capillary fill time. Pedal hair growth present. No varicosities and no lower extremity edema present bilateral.   Neruologic: Grossly intact via light touch bilateral. Vibratory intact via tuning fork bilateral. Protective threshold with Semmes Wienstein monofilament intact to all pedal sites bilateral. Patellar and Achilles deep tendon reflexes 2+ bilateral. No Babinski or clonus noted bilateral.   Musculoskeletal: No gross boney pedal deformities bilateral. No pain, crepitus, or limitation noted with foot and ankle range of motion bilateral. Muscular strength 5/5 in all groups tested bilateral.  Gait: Unassisted, Nonantalgic.    Radiographs:  None taken  Assessment & Plan:   Assessment: Ingrown toenail tibial border hallux right.  Plan: Chemical matrixectomy was performed after local anesthesia was administered. She tolerated procedure well without complications. She was provided with oral home-going instructions for care and soaking of her foot as well as a prescription for Cortisporin Otic applied twice daily soaking. She will follow-up with me in 2 weeks for reevaluation.     Yvette Barr T. [place], [place]

## 2017-07-13 ENCOUNTER — Encounter: Payer: Self-pay | Admitting: Podiatry

## 2017-07-13 ENCOUNTER — Ambulatory Visit (INDEPENDENT_AMBULATORY_CARE_PROVIDER_SITE_OTHER): Payer: Managed Care, Other (non HMO) | Admitting: Podiatry

## 2017-07-13 DIAGNOSIS — L600 Ingrowing nail: Secondary | ICD-10-CM

## 2017-07-13 NOTE — Patient Instructions (Signed)

## 2017-07-13 NOTE — Progress Notes (Signed)
She presents today for follow-up of her matrixectomy tibial and fibular border of the right hallux. She states that she continues to second Betadine and warm water twice daily and seems to be doing very well little bit tender in her steel toed boots.  Objective: Vital signs are stable she is alert and oriented 3 no erythema edema cellulitis drainage or odor at this point. No signs of infection.  Assessment: Well healing matrixectomy hallux right.  Plan: Discontinue Betadine start with Epsom salts and warm water soaks covered during the daytime but leave open at night time. Continue to soak until no redness or drainage and no pain. Follow-up with me as needed

## 2017-07-31 ENCOUNTER — Ambulatory Visit (INDEPENDENT_AMBULATORY_CARE_PROVIDER_SITE_OTHER): Payer: Self-pay | Admitting: Podiatry

## 2017-07-31 DIAGNOSIS — L600 Ingrowing nail: Secondary | ICD-10-CM

## 2017-07-31 MED ORDER — GENTAMICIN SULFATE 0.1 % EX CREA: 1.0000 "application " | TOPICAL_CREAM | Freq: Three times a day (TID) | CUTANEOUS | 1 refills | Status: DC

## 2017-07-31 MED ORDER — DOXYCYCLINE HYCLATE 100 MG PO TABS: 100.0000 mg | ORAL_TABLET | Freq: Two times a day (BID) | ORAL | 0 refills | Status: DC

## 2017-08-03 NOTE — Progress Notes (Signed)
   Subjective: Patient presents today post ingrown nail permanent nail avulsion procedure. She reports associated redness, drainage and states the toe is very sore to the touch. She is here for further evaluation and treatment.    No past medical history on file.  Objective: Skin is warm, dry and supple. Nail and respective nail fold appears to be healing appropriately. Open wound to the associated nail fold with a granular wound base and moderate amount of fibrotic tissue. Minimal drainage noted. Mild erythema around the periungual region likely due to phenol chemical matricectomy.  Assessment: #1 postop permanent partial nail avulsion medial border right great toe #2 open wound periungual nail fold of respective digit.   Plan of care: #1 patient was evaluated  #2 debridement of open wound was performed to the periungual border of the respective toe using a currette. Antibiotic ointment and Band-Aid was applied. #3 Prescription for Gentamycin cream given to patient. #4 Prescription for Doxycycline #14 given to patient. #5 Pt having hip surgery in 3 weeks and needs the toe resolved prior to surgery.  #6 Return to clinic in 1 week.  Edrick Kins, DPM [facility]  Dr. Edrick Kins, [address]                                        [place], [place] [address]                Office [phone]  Fax [phone]

## 2017-08-07 ENCOUNTER — Encounter: Payer: Self-pay | Admitting: Podiatry

## 2017-08-07 ENCOUNTER — Ambulatory Visit (INDEPENDENT_AMBULATORY_CARE_PROVIDER_SITE_OTHER): Payer: Self-pay | Admitting: Podiatry

## 2017-08-07 DIAGNOSIS — L600 Ingrowing nail: Secondary | ICD-10-CM

## 2017-08-08 DIAGNOSIS — Z96641 Presence of right artificial hip joint: Secondary | ICD-10-CM | POA: Insufficient documentation

## 2017-08-10 NOTE — Progress Notes (Signed)
   Subjective: Patient presents today 2 weeks post ingrown nail permanent nail avulsion procedure. Patient states that the toe and nail fold is feeling much better.  History reviewed. No pertinent past medical history.  Objective: Skin is warm, dry and supple. Nail and respective nail fold appears to be healing appropriately. Open wound to the associated nail fold with a granular wound base and moderate amount of fibrotic tissue. Minimal drainage noted. Mild erythema around the periungual region likely due to phenol chemical matricectomy.  Assessment: #1 postop permanent partial nail avulsion medial border of the right great toe #2 open wound periungual nail fold of respective digit.   Plan of care: #1 patient was evaluated  #2 debridement of open wound was performed to the periungual border of the respective toe using a currette. Antibiotic ointment and Band-Aid was applied. #3 patient is to return to clinic on a PRN  basis.   Edrick Kins, DPM [facility]  Dr. Edrick Kins, [address]                                        [place], [place] [address]                Office [phone]  Fax [phone]

## 2017-10-14 ENCOUNTER — Other Ambulatory Visit: Payer: Self-pay

## 2017-10-14 ENCOUNTER — Encounter
Admission: RE | Admit: 2017-10-14 | Discharge: 2017-10-14 | Disposition: A | Discharge status: Home / Self Care | Payer: 59 | Source: Ambulatory Visit | Attending: Orthopedic Surgery | Admitting: Orthopedic Surgery

## 2017-10-14 DIAGNOSIS — M1611 Unilateral primary osteoarthritis, right hip: Secondary | ICD-10-CM | POA: Insufficient documentation

## 2017-10-14 DIAGNOSIS — Z01818 Encounter for other preprocedural examination: Secondary | ICD-10-CM | POA: Diagnosis present

## 2017-10-14 HISTORY — DX: Vascular disorder of intestine, unspecified: K55.9

## 2017-10-14 LAB — SURGICAL PCR SCREEN
MRSA, PCR: NEGATIVE
STAPHYLOCOCCUS AUREUS: POSITIVE — AB

## 2017-10-14 LAB — COMPREHENSIVE METABOLIC PANEL
ALT: 16 U/L (ref 14–54)
AST: 25 U/L (ref 15–41)
Albumin: 4.2 g/dL (ref 3.5–5.0)
Alkaline Phosphatase: 61 U/L (ref 38–126)
Anion gap: 9 (ref 5–15)
BILIRUBIN TOTAL: 0.3 mg/dL (ref 0.3–1.2)
BUN: 31 mg/dL — ABNORMAL HIGH (ref 6–20)
CALCIUM: 9.4 mg/dL (ref 8.9–10.3)
CHLORIDE: 106 mmol/L (ref 101–111)
CO2: 25 mmol/L (ref 22–32)
CREATININE: 0.99 mg/dL (ref 0.44–1.00)
Glucose, Bld: 107 mg/dL — ABNORMAL HIGH (ref 65–99)
Potassium: 3.6 mmol/L (ref 3.5–5.1)
Sodium: 140 mmol/L (ref 135–145)
TOTAL PROTEIN: 7.4 g/dL (ref 6.5–8.1)

## 2017-10-14 LAB — URINALYSIS, ROUTINE W REFLEX MICROSCOPIC
Bilirubin Urine: NEGATIVE
GLUCOSE, UA: NEGATIVE mg/dL
Hgb urine dipstick: NEGATIVE
Ketones, ur: NEGATIVE mg/dL
LEUKOCYTES UA: NEGATIVE
NITRITE: NEGATIVE
PH: 5 (ref 5.0–8.0)
Protein, ur: NEGATIVE mg/dL
SPECIFIC GRAVITY, URINE: 1.021 (ref 1.005–1.030)

## 2017-10-14 LAB — CBC
HEMATOCRIT: 41.5 % (ref 35.0–47.0)
Hemoglobin: 13.9 g/dL (ref 12.0–16.0)
MCH: 31.9 pg (ref 26.0–34.0)
MCHC: 33.5 g/dL (ref 32.0–36.0)
MCV: 95.3 fL (ref 80.0–100.0)
PLATELETS: 238 10*3/uL (ref 150–440)
RBC: 4.36 MIL/uL (ref 3.80–5.20)
RDW: 14.8 % — ABNORMAL HIGH (ref 11.5–14.5)
WBC: 6.3 10*3/uL (ref 3.6–11.0)

## 2017-10-14 LAB — TYPE AND SCREEN
ABO/RH(D): A POS
Antibody Screen: NEGATIVE

## 2017-10-14 LAB — PROTIME-INR
INR: 0.97
PROTHROMBIN TIME: 12.8 s (ref 11.4–15.2)

## 2017-10-14 LAB — APTT: aPTT: 31 seconds (ref 24–36)

## 2017-10-14 LAB — C-REACTIVE PROTEIN: CRP: <0.8 mg/dL (ref <1.0)

## 2017-10-14 LAB — SEDIMENTATION RATE: Sed Rate: 9 mm/hr (ref 0–30)

## 2017-10-14 NOTE — Patient Instructions (Signed)
Your procedure is scheduled on: Monday, October 26, 2017 Report to Same Day Surgery on the 2nd floor in the [facility]. To find out your arrival time, please call [phone] between 1PM - 3PM on: Friday, October 23, 2017  REMEMBER: Instructions that are not followed completely may result in serious medical risk, up to and including death; or upon the discretion of your surgeon and anesthesiologist your surgery may need to be rescheduled.  Do not eat food after midnight the night before your procedure.  No gum chewing or hard candies.  You may however, drink CLEAR liquids up to 2 hours before you are scheduled to arrive at the hospital for your procedure.  Do not drink clear liquids within 2 hours of the start of your surgery.  Clear liquids include: - water  - apple juice without pulp - clear gatorade - black coffee or tea (Do NOT add anything to the coffee or tea) Do NOT drink anything that is not on this list.  No Alcohol for 24 hours before or after surgery.  No Smoking including e-cigarettes for 24 hours prior to surgery. No chewable tobacco products for at least 6 hours prior to surgery. No nicotine patches on the day of surgery.  Notify your doctor if there is any change in your medical condition (cold, fever, infection).  Do not wear jewelry, make-up, hairpins, clips or nail polish.  Do not wear lotions, powders, or perfumes. You may wear deodorant.  Do not shave 48 hours prior to surgery.   Contacts and dentures may not be worn into surgery.  Do not bring valuables to the hospital. [facility] is not responsible for any belongings or valuables.  TAKE THESE MEDICATIONS THE MORNING OF SURGERY WITH A SIP OF WATER:  NONE  Use CHG Soap as directed on instruction sheet.  ON January 20 - Stop Anti-inflammatories such as BC POWDER, Advil, Aleve, Ibuprofen, Motrin, Naproxen, Naprosyn, Goodie powder, or aspirin products. (May take Tylenol or Acetaminophen if needed.)  ON  January 20 - Stop ANY OVER THE COUNTER supplements until after surgery.   If you are being admitted to the hospital overnight, leave your suitcase in the car. After surgery it may be brought to your room.  Please call the number above if you have any questions about these instructions.

## 2017-10-15 NOTE — Pre-Procedure Instructions (Signed)
Positive staph result on PCR screen faxed to Dr [employer] office

## 2017-10-16 LAB — URINE CULTURE
Culture: <10000 — AB
Special Requests: NORMAL

## 2017-10-25 MED ORDER — TRANEXAMIC ACID 1000 MG/10ML IV SOLN
1000.0000 mg | INTRAVENOUS | Status: AC
  Administered 2017-10-26: 1000 mg via INTRAVENOUS
  Filled 2017-10-25: qty 10

## 2017-10-25 MED ORDER — CEFAZOLIN SODIUM-DEXTROSE 2-4 GM/100ML-% IV SOLN
2.0000 g | INTRAVENOUS | Status: AC
  Administered 2017-10-26: 2 g via INTRAVENOUS

## 2017-10-25 NOTE — Discharge Instructions (Signed)
Instructions after Total Hip Replacement ° ° °  Naila Elizondo P. Ladonna Vanorder, Jr., M.D.    ° Dept. of Orthopaedics & Sports Medicine ° [facility] ° [address] ° [place], [place]  [address] ° Phone: [phone]   Fax: [phone] ° °  °DIET: °• Drink plenty of non-alcoholic fluids. °• Resume your normal diet. Include foods high in fiber. ° °ACTIVITY:  °• You may use crutches or a walker with weight-bearing as tolerated, unless instructed otherwise. °• You may be weaned off of the walker or crutches by your Physical Therapist.  °• Do NOT reach below the level of your knees or cross your legs until allowed.    °• Continue doing gentle exercises. Exercising will reduce the pain and swelling, increase motion, and prevent muscle weakness.   °• Please continue to use the TED compression stockings for 6 weeks. You may remove the stockings at night, but should reapply them in the morning. °• Do not drive or operate any equipment until instructed. ° °WOUND CARE:  °• Continue to use ice packs periodically to reduce pain and swelling. °• Keep the incision clean and dry. °• You may bathe or shower after the staples are removed at the first office visit following surgery. ° °MEDICATIONS: °• You may resume your regular medications. °• Please take the pain medication as prescribed on the medication. °• Do not take pain medication on an empty stomach. °• You have been given a prescription for a blood thinner to prevent blood clots. Please take the medication as instructed. (NOTE: After completing a 2 week course of Lovenox, take one Enteric-coated aspirin once a day.) °• Pain medications and iron supplements can cause constipation. Use a stool softener (Senokot or Colace) on a daily basis and a laxative (dulcolax or miralax) as needed. °• Do not drive or drink alcoholic beverages when taking pain medications. ° °CALL THE OFFICE FOR: °• Temperature above 101 degrees °• Excessive bleeding or drainage on the dressing. °• Excessive  swelling, coldness, or paleness of the toes. °• Persistent nausea and vomiting. ° °FOLLOW-UP:  °• You should have an appointment to return to the office in 6 weeks after surgery. °• Arrangements have been made for continuation of Physical Therapy (either home therapy or outpatient therapy). °  °

## 2017-10-26 ENCOUNTER — Inpatient Hospital Stay
Admission: RE | Admit: 2017-10-26 | Discharge: 2017-10-28 | DRG: 470 | Disposition: A | Discharge status: Skilled Nursing Facility | Payer: 59 | Source: Ambulatory Visit | Attending: Orthopedic Surgery | Admitting: Orthopedic Surgery

## 2017-10-26 ENCOUNTER — Inpatient Hospital Stay: Payer: 59 | Admitting: Anesthesiology

## 2017-10-26 ENCOUNTER — Other Ambulatory Visit: Payer: Self-pay

## 2017-10-26 ENCOUNTER — Encounter
Admission: RE | Disposition: A | Discharge status: Skilled Nursing Facility | Payer: Self-pay | Source: Ambulatory Visit | Attending: Orthopedic Surgery

## 2017-10-26 ENCOUNTER — Encounter: Payer: Self-pay | Admitting: Orthopedic Surgery

## 2017-10-26 ENCOUNTER — Inpatient Hospital Stay: Payer: 59

## 2017-10-26 DIAGNOSIS — Z8719 Personal history of other diseases of the digestive system: Secondary | ICD-10-CM | POA: Diagnosis not present

## 2017-10-26 DIAGNOSIS — Z885 Allergy status to narcotic agent status: Secondary | ICD-10-CM

## 2017-10-26 DIAGNOSIS — Z96649 Presence of unspecified artificial hip joint: Secondary | ICD-10-CM

## 2017-10-26 DIAGNOSIS — M1611 Unilateral primary osteoarthritis, right hip: Principal | ICD-10-CM | POA: Diagnosis present

## 2017-10-26 DIAGNOSIS — Z23 Encounter for immunization: Secondary | ICD-10-CM | POA: Diagnosis not present

## 2017-10-26 HISTORY — PX: TOTAL HIP ARTHROPLASTY: SHX124

## 2017-10-26 LAB — ABO/RH: ABO/RH(D): A POS

## 2017-10-26 SURGERY — ARTHROPLASTY, HIP, TOTAL,POSTERIOR APPROACH
Anesthesia: Spinal | Laterality: Right

## 2017-10-26 MED ORDER — LIDOCAINE HCL (PF) 2 % IJ SOLN
INTRAMUSCULAR | Status: AC
  Filled 2017-10-26: qty 10

## 2017-10-26 MED ORDER — GABAPENTIN 300 MG PO CAPS
ORAL_CAPSULE | ORAL | Status: AC
  Administered 2017-10-26: 300 mg via ORAL
  Filled 2017-10-26: qty 1

## 2017-10-26 MED ORDER — ONDANSETRON HCL 4 MG PO TABS
4.0000 mg | ORAL_TABLET | Freq: Four times a day (QID) | ORAL | Status: DC | PRN
Start: 2017-10-26 — End: 2017-10-28

## 2017-10-26 MED ORDER — CEFAZOLIN SODIUM-DEXTROSE 2-4 GM/100ML-% IV SOLN
INTRAVENOUS | Status: AC
  Filled 2017-10-26: qty 100

## 2017-10-26 MED ORDER — PHENOL 1.4 % MT LIQD
1.0000 | OROMUCOSAL | Status: DC | PRN
  Filled 2017-10-26: qty 177

## 2017-10-26 MED ORDER — PROMETHAZINE HCL 25 MG/ML IJ SOLN: 6.2500 mg | INTRAMUSCULAR | Status: DC | PRN

## 2017-10-26 MED ORDER — GLYCOPYRROLATE 0.2 MG/ML IJ SOLN
INTRAMUSCULAR | Status: DC | PRN
  Administered 2017-10-26: 0.2 mg via INTRAVENOUS

## 2017-10-26 MED ORDER — PHENYLEPHRINE HCL 10 MG/ML IJ SOLN
INTRAMUSCULAR | Status: AC
  Filled 2017-10-26: qty 1

## 2017-10-26 MED ORDER — BUPIVACAINE-EPINEPHRINE (PF) 0.25% -1:200000 IJ SOLN
INTRAMUSCULAR | Status: AC
  Filled 2017-10-26: qty 20

## 2017-10-26 MED ORDER — OXYCODONE HCL 5 MG PO TABS: 5.0000 mg | ORAL_TABLET | Freq: Once | ORAL | Status: DC | PRN

## 2017-10-26 MED ORDER — FAMOTIDINE 20 MG PO TABS
ORAL_TABLET | ORAL | Status: AC
  Administered 2017-10-26: 20 mg via ORAL
  Filled 2017-10-26: qty 1

## 2017-10-26 MED ORDER — GLYCOPYRROLATE 0.2 MG/ML IJ SOLN
INTRAMUSCULAR | Status: AC
  Filled 2017-10-26: qty 1

## 2017-10-26 MED ORDER — PANTOPRAZOLE SODIUM 40 MG PO TBEC
40.0000 mg | DELAYED_RELEASE_TABLET | Freq: Two times a day (BID) | ORAL | Status: DC
  Administered 2017-10-26 – 2017-10-28 (×5): 40 mg via ORAL
  Filled 2017-10-26 (×5): qty 1

## 2017-10-26 MED ORDER — NEOMYCIN-POLYMYXIN B GU 40-200000 IR SOLN
Status: AC
  Filled 2017-10-26: qty 20

## 2017-10-26 MED ORDER — ALUM & MAG HYDROXIDE-SIMETH 200-200-20 MG/5ML PO SUSP: 30.0000 mL | ORAL | Status: DC | PRN

## 2017-10-26 MED ORDER — DIPHENHYDRAMINE HCL 12.5 MG/5ML PO ELIX: 12.5000 mg | ORAL_SOLUTION | ORAL | Status: DC | PRN

## 2017-10-26 MED ORDER — DEXAMETHASONE SODIUM PHOSPHATE 10 MG/ML IJ SOLN
INTRAMUSCULAR | Status: AC
  Administered 2017-10-26: 8 mg via INTRAVENOUS
  Filled 2017-10-26: qty 1

## 2017-10-26 MED ORDER — SENNOSIDES-DOCUSATE SODIUM 8.6-50 MG PO TABS
1.0000 | ORAL_TABLET | Freq: Two times a day (BID) | ORAL | Status: DC
  Administered 2017-10-26 – 2017-10-28 (×5): 1 via ORAL
  Filled 2017-10-26 (×5): qty 1

## 2017-10-26 MED ORDER — ACETAMINOPHEN 650 MG RE SUPP: 650.0000 mg | RECTAL | Status: DC | PRN

## 2017-10-26 MED ORDER — ENOXAPARIN SODIUM 30 MG/0.3ML ~~LOC~~ SOLN
30.0000 mg | Freq: Two times a day (BID) | SUBCUTANEOUS | Status: DC
  Administered 2017-10-27 – 2017-10-28 (×3): 30 mg via SUBCUTANEOUS
  Filled 2017-10-26 (×3): qty 0.3

## 2017-10-26 MED ORDER — CHLORHEXIDINE GLUCONATE 4 % EX LIQD: 60.0000 mL | Freq: Once | CUTANEOUS | Status: DC

## 2017-10-26 MED ORDER — DEXAMETHASONE SODIUM PHOSPHATE 10 MG/ML IJ SOLN
8.0000 mg | Freq: Once | INTRAMUSCULAR | Status: AC
  Administered 2017-10-26: 8 mg via INTRAVENOUS

## 2017-10-26 MED ORDER — GABAPENTIN 300 MG PO CAPS
300.0000 mg | ORAL_CAPSULE | Freq: Every day | ORAL | Status: DC
  Administered 2017-10-26 – 2017-10-27 (×2): 300 mg via ORAL
  Filled 2017-10-26 (×2): qty 1

## 2017-10-26 MED ORDER — MAGNESIUM HYDROXIDE 400 MG/5ML PO SUSP
30.0000 mL | Freq: Every day | ORAL | Status: DC | PRN
  Administered 2017-10-26 – 2017-10-27 (×2): 30 mL via ORAL
  Filled 2017-10-26 (×2): qty 30

## 2017-10-26 MED ORDER — BUPIVACAINE HCL (PF) 0.5 % IJ SOLN
INTRAMUSCULAR | Status: DC | PRN
  Administered 2017-10-26: 3 mL

## 2017-10-26 MED ORDER — PROPOFOL 10 MG/ML IV BOLUS
INTRAVENOUS | Status: DC | PRN
  Administered 2017-10-26 (×2): 14 mg via INTRAVENOUS

## 2017-10-26 MED ORDER — FENTANYL CITRATE (PF) 100 MCG/2ML IJ SOLN
INTRAMUSCULAR | Status: DC | PRN
  Administered 2017-10-26: 50 ug via INTRAVENOUS

## 2017-10-26 MED ORDER — MENTHOL 3 MG MT LOZG
1.0000 | LOZENGE | OROMUCOSAL | Status: DC | PRN
  Filled 2017-10-26: qty 9

## 2017-10-26 MED ORDER — MEPERIDINE HCL 50 MG/ML IJ SOLN: 6.2500 mg | INTRAMUSCULAR | Status: DC | PRN

## 2017-10-26 MED ORDER — FENTANYL CITRATE (PF) 100 MCG/2ML IJ SOLN
INTRAMUSCULAR | Status: AC
  Filled 2017-10-26: qty 2

## 2017-10-26 MED ORDER — TRANEXAMIC ACID 1000 MG/10ML IV SOLN
1000.0000 mg | Freq: Once | INTRAVENOUS | Status: DC
  Filled 2017-10-26: qty 10

## 2017-10-26 MED ORDER — PROPOFOL 500 MG/50ML IV EMUL
INTRAVENOUS | Status: DC | PRN
  Administered 2017-10-26: 70 ug/kg/min via INTRAVENOUS

## 2017-10-26 MED ORDER — HYDROMORPHONE HCL 1 MG/ML IJ SOLN
0.5000 mg | INTRAMUSCULAR | Status: DC | PRN
  Administered 2017-10-26: 0.5 mg via INTRAVENOUS
  Filled 2017-10-26: qty 1

## 2017-10-26 MED ORDER — NEOMYCIN-POLYMYXIN B GU 40-200000 IR SOLN
Status: DC | PRN
  Administered 2017-10-26: 16 mL

## 2017-10-26 MED ORDER — SODIUM CHLORIDE 0.9 % IV SOLN
INTRAVENOUS | Status: DC
  Administered 2017-10-26 (×2): via INTRAVENOUS

## 2017-10-26 MED ORDER — FENTANYL CITRATE (PF) 100 MCG/2ML IJ SOLN
INTRAMUSCULAR | Status: AC
  Administered 2017-10-26: 25 ug via INTRAVENOUS
  Filled 2017-10-26: qty 2

## 2017-10-26 MED ORDER — PHENYLEPHRINE HCL 10 MG/ML IJ SOLN
INTRAMUSCULAR | Status: DC | PRN
  Administered 2017-10-26: 30 ug/min via INTRAVENOUS

## 2017-10-26 MED ORDER — METOCLOPRAMIDE HCL 10 MG PO TABS
10.0000 mg | ORAL_TABLET | Freq: Three times a day (TID) | ORAL | Status: AC
  Administered 2017-10-26 – 2017-10-28 (×8): 10 mg via ORAL
  Filled 2017-10-26 (×8): qty 1

## 2017-10-26 MED ORDER — PROPOFOL 500 MG/50ML IV EMUL
INTRAVENOUS | Status: AC
  Filled 2017-10-26: qty 50

## 2017-10-26 MED ORDER — CEFAZOLIN SODIUM-DEXTROSE 2-4 GM/100ML-% IV SOLN
2.0000 g | Freq: Four times a day (QID) | INTRAVENOUS | Status: AC
  Administered 2017-10-26 – 2017-10-27 (×4): 2 g via INTRAVENOUS
  Filled 2017-10-26 (×4): qty 100

## 2017-10-26 MED ORDER — CELECOXIB 200 MG PO CAPS
200.0000 mg | ORAL_CAPSULE | Freq: Two times a day (BID) | ORAL | Status: DC
  Administered 2017-10-26 – 2017-10-28 (×5): 200 mg via ORAL
  Filled 2017-10-26 (×5): qty 1

## 2017-10-26 MED ORDER — ACETAMINOPHEN 10 MG/ML IV SOLN
1000.0000 mg | Freq: Four times a day (QID) | INTRAVENOUS | Status: AC
  Administered 2017-10-26 – 2017-10-27 (×4): 1000 mg via INTRAVENOUS
  Filled 2017-10-26 (×4): qty 100

## 2017-10-26 MED ORDER — OXYCODONE HCL 5 MG PO TABS
5.0000 mg | ORAL_TABLET | ORAL | Status: DC | PRN
  Administered 2017-10-26 – 2017-10-28 (×6): 5 mg via ORAL
  Filled 2017-10-26 (×6): qty 1

## 2017-10-26 MED ORDER — TRAMADOL HCL 50 MG PO TABS
50.0000 mg | ORAL_TABLET | ORAL | Status: DC | PRN
  Administered 2017-10-26 – 2017-10-27 (×4): 50 mg via ORAL
  Administered 2017-10-27: 100 mg via ORAL
  Administered 2017-10-28 (×2): 50 mg via ORAL
  Filled 2017-10-26 (×2): qty 1
  Filled 2017-10-26: qty 2
  Filled 2017-10-26 (×4): qty 1

## 2017-10-26 MED ORDER — FLEET ENEMA 7-19 GM/118ML RE ENEM: 1.0000 | ENEMA | Freq: Once | RECTAL | Status: DC | PRN

## 2017-10-26 MED ORDER — PHENYLEPHRINE HCL 10 MG/ML IJ SOLN
INTRAMUSCULAR | Status: DC | PRN
  Administered 2017-10-26: 100 ug via INTRAVENOUS

## 2017-10-26 MED ORDER — INFLUENZA VAC SPLIT QUAD 0.5 ML IM SUSY
0.5000 mL | PREFILLED_SYRINGE | INTRAMUSCULAR | Status: AC
  Administered 2017-10-27: 0.5 mL via INTRAMUSCULAR
  Filled 2017-10-26: qty 0.5

## 2017-10-26 MED ORDER — MIDAZOLAM HCL 5 MG/5ML IJ SOLN
INTRAMUSCULAR | Status: AC
  Filled 2017-10-26: qty 5

## 2017-10-26 MED ORDER — MIDAZOLAM HCL 5 MG/5ML IJ SOLN
INTRAMUSCULAR | Status: DC | PRN
  Administered 2017-10-26 (×2): 2 mg via INTRAVENOUS

## 2017-10-26 MED ORDER — ACETAMINOPHEN 10 MG/ML IV SOLN
INTRAVENOUS | Status: AC
  Filled 2017-10-26: qty 100

## 2017-10-26 MED ORDER — BISACODYL 10 MG RE SUPP
10.0000 mg | Freq: Every day | RECTAL | Status: DC | PRN
  Administered 2017-10-28: 10 mg via RECTAL
  Filled 2017-10-26: qty 1

## 2017-10-26 MED ORDER — ONDANSETRON HCL 4 MG/2ML IJ SOLN: 4.0000 mg | Freq: Four times a day (QID) | INTRAMUSCULAR | Status: DC | PRN

## 2017-10-26 MED ORDER — FAMOTIDINE 20 MG PO TABS
20.0000 mg | ORAL_TABLET | Freq: Once | ORAL | Status: AC
  Administered 2017-10-26: 20 mg via ORAL

## 2017-10-26 MED ORDER — LACTATED RINGERS IV SOLN
INTRAVENOUS | Status: DC
  Administered 2017-10-26 (×2): via INTRAVENOUS

## 2017-10-26 MED ORDER — OXYCODONE HCL 5 MG/5ML PO SOLN: 5.0000 mg | Freq: Once | ORAL | Status: DC | PRN

## 2017-10-26 MED ORDER — FENTANYL CITRATE (PF) 100 MCG/2ML IJ SOLN
25.0000 ug | INTRAMUSCULAR | Status: DC | PRN
  Administered 2017-10-26: 25 ug via INTRAVENOUS

## 2017-10-26 MED ORDER — GABAPENTIN 300 MG PO CAPS
300.0000 mg | ORAL_CAPSULE | Freq: Once | ORAL | Status: AC
  Administered 2017-10-26: 300 mg via ORAL

## 2017-10-26 MED ORDER — ACETAMINOPHEN 325 MG PO TABS: 650.0000 mg | ORAL_TABLET | ORAL | Status: DC | PRN

## 2017-10-26 MED ORDER — FERROUS SULFATE 325 (65 FE) MG PO TABS
325.0000 mg | ORAL_TABLET | Freq: Two times a day (BID) | ORAL | Status: DC
  Administered 2017-10-26 – 2017-10-28 (×4): 325 mg via ORAL
  Filled 2017-10-26 (×4): qty 1

## 2017-10-26 SURGICAL SUPPLY — 53 items
BLADE DRUM FLTD (BLADE) ×2 IMPLANT
BLADE SAW 1 (BLADE) ×2 IMPLANT
CANISTER SUCT 1200ML W/VALVE (MISCELLANEOUS) ×2 IMPLANT
CANISTER SUCT 3000ML PPV (MISCELLANEOUS) ×4 IMPLANT
CAPT HIP TOTAL 2 ×2 IMPLANT
CARTRIDGE OIL MAESTRO DRILL (MISCELLANEOUS) ×1 IMPLANT
DIFFUSER DRILL AIR PNEUMATIC (MISCELLANEOUS) ×2 IMPLANT
DRAPE INCISE IOBAN 66X60 STRL (DRAPES) ×2 IMPLANT
DRAPE SHEET LG 3/4 BI-LAMINATE (DRAPES) ×2 IMPLANT
DRSG DERMACEA 8X12 NADH (GAUZE/BANDAGES/DRESSINGS) ×2 IMPLANT
DRSG OPSITE POSTOP 4X12 (GAUZE/BANDAGES/DRESSINGS) IMPLANT
DRSG OPSITE POSTOP 4X14 (GAUZE/BANDAGES/DRESSINGS) ×2 IMPLANT
DRSG TEGADERM 4X4.75 (GAUZE/BANDAGES/DRESSINGS) ×2 IMPLANT
DURAPREP 26ML APPLICATOR (WOUND CARE) ×2 IMPLANT
ELECT BLADE 6.5 EXT (BLADE) ×2 IMPLANT
ELECT CAUTERY BLADE 6.4 (BLADE) ×2 IMPLANT
EVACUATOR 1/8 PVC DRAIN (DRAIN) ×2 IMPLANT
GLOVE BIOGEL M STRL SZ7.5 (GLOVE) ×6 IMPLANT
GLOVE BIOGEL PI IND STRL 9 (GLOVE) ×4 IMPLANT
GLOVE BIOGEL PI INDICATOR 9 (GLOVE) ×4
GLOVE INDICATOR 8.0 STRL GRN (GLOVE) ×10 IMPLANT
GLOVE SURG SYN 9.0  PF PI (GLOVE) ×1
GLOVE SURG SYN 9.0 PF PI (GLOVE) ×1 IMPLANT
GOWN STRL REUS W/ TWL LRG LVL3 (GOWN DISPOSABLE) ×4 IMPLANT
GOWN STRL REUS W/TWL 2XL LVL3 (GOWN DISPOSABLE) ×2 IMPLANT
GOWN STRL REUS W/TWL LRG LVL3 (GOWN DISPOSABLE) ×4
HOLDER FOLEY CATH W/STRAP (MISCELLANEOUS) ×2 IMPLANT
HOOD PEEL AWAY FLYTE STAYCOOL (MISCELLANEOUS) ×4 IMPLANT
KIT RM TURNOVER STRD PROC AR (KITS) ×2 IMPLANT
NDL SAFETY ECLIPSE 18X1.5 (NEEDLE) ×1 IMPLANT
NEEDLE HYPO 18GX1.5 SHARP (NEEDLE) ×1
NS IRRIG 500ML POUR BTL (IV SOLUTION) ×2 IMPLANT
OIL CARTRIDGE MAESTRO DRILL (MISCELLANEOUS) ×2
PACK HIP PROSTHESIS (MISCELLANEOUS) ×2 IMPLANT
PIN STEIN THRED 5/32 (Pin) ×2 IMPLANT
PULSAVAC PLUS IRRIG FAN TIP (DISPOSABLE) ×2
SOL .9 NS 3000ML IRR  AL (IV SOLUTION) ×1
SOL .9 NS 3000ML IRR UROMATIC (IV SOLUTION) ×1 IMPLANT
SOL PREP PVP 2OZ (MISCELLANEOUS) ×2
SOLUTION PREP PVP 2OZ (MISCELLANEOUS) ×1 IMPLANT
SPONGE DRAIN TRACH 4X4 STRL 2S (GAUZE/BANDAGES/DRESSINGS) ×2 IMPLANT
STAPLER SKIN PROX 35W (STAPLE) ×2 IMPLANT
SUT ETHIBOND #5 BRAIDED 30INL (SUTURE) ×2 IMPLANT
SUT VIC AB 0 CT1 36 (SUTURE) ×2 IMPLANT
SUT VIC AB 1 CT1 36 (SUTURE) ×4 IMPLANT
SUT VIC AB 2-0 CT1 27 (SUTURE) ×1
SUT VIC AB 2-0 CT1 TAPERPNT 27 (SUTURE) ×1 IMPLANT
SYR 20CC LL (SYRINGE) ×2 IMPLANT
TAPE ADH 3 LX (MISCELLANEOUS) ×2 IMPLANT
TAPE TRANSPORE STRL 2 31045 (GAUZE/BANDAGES/DRESSINGS) ×2 IMPLANT
TIP FAN IRRIG PULSAVAC PLUS (DISPOSABLE) ×1 IMPLANT
TOWEL OR 17X26 4PK STRL BLUE (TOWEL DISPOSABLE) ×2 IMPLANT
TRAY FOLEY W/METER SILVER 16FR (SET/KITS/TRAYS/PACK) ×2 IMPLANT

## 2017-10-26 NOTE — Progress Notes (Signed)
Clinical Social Worker (CSW) presented bed offers to patient and she chose [facility]. Per Alger Simons liaison he will start [facility] authorization once PT note is available.  [employer], LCSW [phone]

## 2017-10-26 NOTE — Anesthesia Preprocedure Evaluation (Signed)
Anesthesia Evaluation  Patient identified by MRN, date of birth, ID band Patient awake    Reviewed: Allergy & Precautions, NPO status , Patient's Chart, lab work & pertinent test results  History of Anesthesia Complications Negative for: history of anesthetic complications  Airway Mallampati: II  TM Distance: >3 FB Neck ROM: Full    Dental no notable dental hx.    Pulmonary neg sleep apnea, neg COPD, Current Smoker,    breath sounds clear to auscultation- rhonchi (-) wheezing      Cardiovascular Exercise Tolerance: Good (-) hypertension(-) CAD, (-) Past MI, (-) Cardiac Stents and (-) CABG  Rhythm:Regular Rate:Normal - Systolic murmurs and - Diastolic murmurs    Neuro/Psych negative neurological ROS  negative psych ROS   GI/Hepatic negative GI ROS, Neg liver ROS,   Endo/Other  negative endocrine ROSneg diabetes  Renal/GU negative Renal ROS     Musculoskeletal  (+) Arthritis ,   Abdominal (+) - obese,   Peds  Hematology negative hematology ROS (+)   Anesthesia Other Findings Past Medical History: No date: Ischemic colitis ([place])   Reproductive/Obstetrics                             Lab Results  Component Value Date   WBC 6.3 10/14/2017   HGB 13.9 10/14/2017   HCT 41.5 10/14/2017   MCV 95.3 10/14/2017   PLT 238 10/14/2017    Anesthesia Physical Anesthesia Plan  ASA: II  Anesthesia Plan: Spinal   Post-op Pain Management:    Induction:   PONV Risk Score and Plan: 1 and Propofol infusion  Airway Management Planned: Natural Airway  Additional Equipment:   Intra-op Plan:   Post-operative Plan:   Informed Consent: I have reviewed the patients History and Physical, chart, labs and discussed the procedure including the risks, benefits and alternatives for the proposed anesthesia with the patient or authorized representative who has indicated his/her understanding and  acceptance.   Dental advisory given  Plan Discussed with: CRNA and Anesthesiologist  Anesthesia Plan Comments:         Anesthesia Quick Evaluation

## 2017-10-26 NOTE — Anesthesia Post-op Follow-up Note (Signed)
Anesthesia QCDR form completed.        

## 2017-10-26 NOTE — Anesthesia Procedure Notes (Signed)
Spinal  Patient location during procedure: OR Start time: 10/26/2017 7:14 AM End time: 10/26/2017 7:18 AM Staffing Performed: resident/CRNA  Preanesthetic Checklist Completed: patient identified, site marked, surgical consent, pre-op evaluation, timeout performed, IV checked, risks and benefits discussed and monitors and equipment checked Spinal Block Patient position: sitting Prep: ChloraPrep Patient monitoring: heart rate, continuous pulse ox, blood pressure and cardiac monitor Approach: midline Location: L4-5 Injection technique: single-shot Needle Needle type: Introducer and Pencan  Needle gauge: 24 G Needle length: 9 cm Additional Notes Negative paresthesia. Negative blood return. Positive free-flowing CSF. Expiration date of kit checked and confirmed. Patient tolerated procedure well, without complications.

## 2017-10-26 NOTE — Clinical Social Work Placement (Signed)
   CLINICAL SOCIAL WORK PLACEMENT  NOTE  Date:  10/26/2017  Patient Details  Name: Yvette Barr MRN: [id number] Date of Birth: [birth year]  Clinical Social Work is seeking post-discharge placement for this patient at the [place] level of care (*CSW will initial, date and re-position this form in  chart as items are completed):  Yes   Patient/family provided with [facility] Work Department's list of facilities offering this level of care within the geographic area requested by the patient (or if unable, by the patient's family).  Yes   Patient/family informed of their freedom to choose among providers that offer the needed level of care, that participate in Medicare, Medicaid or managed care program needed by the patient, have an available bed and are willing to accept the patient.  Yes   Patient/family informed of [facility]'s ownership interest in [facility] and [facility], as well as of the fact that they are under no obligation to receive care at these facilities.  PASRR submitted to EDS on 10/26/17     PASRR number received on 10/26/17     Existing PASRR number confirmed on       FL2 transmitted to all facilities in geographic area requested by pt/family on 10/26/17     FL2 transmitted to all facilities within larger geographic area on       Patient informed that his/her managed care company has contracts with or will negotiate with certain facilities, including the following:            Patient/family informed of bed offers received.  Patient chooses bed at       Physician recommends and patient chooses bed at      Patient to be transferred to   on  .  Patient to be transferred to facility by       Patient family notified on   of transfer.  Name of family member notified:        PHYSICIAN       Additional Comment:    _______________________________________________ Laderrick Wilk, Veronia Beets, LCSW 10/26/2017, 2:41 PM

## 2017-10-26 NOTE — Op Note (Signed)
OPERATIVE NOTE  DATE OF SURGERY:  10/26/2017  PATIENT NAME:  Yvette Barr   DOB: 07/08/1961  MRN: [id number]  PRE-OPERATIVE DIAGNOSIS: Degenerative arthrosis of the right hip, primary  POST-OPERATIVE DIAGNOSIS:  Same  PROCEDURE:  Right total hip arthroplasty  SURGEON:  Marciano Sequin. M.D.  ASSISTANT:  Vance Peper, PA (present and scrubbed throughout the case, critical for assistance with exposure, retraction, instrumentation, and closure)  ANESTHESIA: spinal  ESTIMATED BLOOD LOSS: 100 mL  FLUIDS REPLACED: 1000 mL of crystalloid  DRAINS: 2 medium drains to a Hemovac reservoir  IMPLANTS UTILIZED: DePuy 12 mm large stature AML femoral stem, 50 mm OD Pinnacle 100 acetabular component, +4 mm 10 degree Pinnacle Marathon polyethylene insert, and a 32 mm CoCr +1 mm hip ball  INDICATIONS FOR SURGERY: Yvette Barr is a 57 y.o. year old female with a long history of progressive hip and groin  pain. X-rays demonstrated severe degenerative changes. The patient had not seen any significant improvement despite conservative nonsurgical intervention. After discussion of the risks and benefits of surgical intervention, the patient expressed understanding of the risks benefits and agree with plans for total hip arthroplasty.   The risks, benefits, and alternatives were discussed at length including but not limited to the risks of infection, bleeding, nerve injury, stiffness, blood clots, the need for revision surgery, limb length inequality, dislocation, cardiopulmonary complications, among others, and they were willing to proceed.  PROCEDURE IN DETAIL: The patient was brought into the operating room and, after adequate spinal anesthesia was achieved, the patient was placed in a left lateral decubitus position. Axillary roll was placed and all bony prominences were well-padded. The patient's right hip was cleaned and prepped with alcohol and DuraPrep and draped in the usual sterile fashion. A "timeout"  was performed as per usual protocol. A lateral curvilinear incision was made gently curving towards the posterior superior iliac spine. The IT band was incised in line with the skin incision and the fibers of the gluteus maximus were split in line. The piriformis tendon was identified, skeletonized, and incised at its insertion to the proximal femur and reflected posteriorly. A T type posterior capsulotomy was performed. Prior to dislocation of the femoral head, a threaded Steinmann pin was inserted through a separate stab incision into the pelvis superior to the acetabulum and bent in the form of a stylus so as to assess limb length and hip offset throughout the procedure. The femoral head was then dislocated posteriorly. Inspection of the femoral head demonstrated severe degenerative changes with full-thickness loss of articular cartilage. The femoral neck cut was performed using an oscillating saw. The anterior capsule was elevated off of the femoral neck using a periosteal elevator. Attention was then directed to the acetabulum. The remnant of the labrum was excised using electrocautery. Inspection of the acetabulum also demonstrated significant degenerative changes. The acetabulum was reamed in sequential fashion up to a 49 mm diameter. Good punctate bleeding bone was encountered. A 50 mm Pinnacle 100 acetabular component was positioned and impacted into place. Good scratch fit was appreciated. A neutral polyethylene trial was inserted.  Attention was then directed to the proximal femur. A hole for reaming of the proximal femoral canal was created using a high-speed burr. The femoral canal was reamed in sequential fashion up to a 11.5 mm diameter. This allowed for approximately 8 cm of scratch fit.  It was thus elected to ream up to a 12 mm diameter to allow for a line to line  fit.  Serial broaches were inserted up to a 12 mm large stature femoral broach. Calcar region was planed and a trial reduction was  performed using a 32 mm hip ball with a +1 mm neck length.  Reasonably good stability was appreciated but it was elected to exchange the neutral polyethylene for a +4 mm 10 degree trial with the high side positioned at the 8 o'clock position.  Good equalization of limb lengths and hip offset was appreciated and excellent stability was noted both anteriorly and posteriorly. Trial components were removed. The acetabular shell was irrigated with copious amounts of normal saline with antibiotic solution and suctioned dry. A +4 mm 10 degree Pinnacle Marathon polyethylene insert was positioned with the high side at the 8 o'clock position and impacted into place. Next, a 12 mm large stature AML femoral stem was positioned and impacted into place. Excellent scratch fit was appreciated. A trial reduction was again performed with a 32 mm hip ball with a +1 mm neck length. Again, good equalization of limb lengths was appreciated and excellent stability appreciated both anteriorly and posteriorly. The hip was then dislocated and the trial hip ball was removed. The Morse taper was cleaned and dried. A 32 mm cobalt chrome hip ball with a +1 mm neck length was placed on the trunnion and impacted into place. The hip was then reduced and placed through range of motion. Excellent stability was appreciated both anteriorly and posteriorly.  The wound was irrigated with copious amounts of normal saline with antibiotic solution and suctioned dry. Good hemostasis was appreciated. The posterior capsulotomy was repaired using #5 Ethibond. Piriformis tendon was reapproximated to the undersurface of the gluteus medius tendon using #5 Ethibond. Two medium drains were placed in the wound bed and brought out through separate stab incisions to be attached to a Hemovac reservoir. The IT band was reapproximated using interrupted sutures of #1 Vicryl. Subcutaneous tissue was approximated using first #0 Vicryl followed by #2-0 Vicryl. The skin was  closed with skin staples.  The patient tolerated the procedure well and was transported to the recovery room in stable condition.   Marciano Sequin., M.D.

## 2017-10-26 NOTE — NC FL2 (Signed)
[place] LEVEL OF CARE SCREENING TOOL     IDENTIFICATION  Patient Name: Yvette Barr Birthdate: 1961-03-01 Sex: female Admission Date (Current Location): 10/26/2017  [place] and [place] Number:  Engineering geologist and Address:  [facility], [address], [place], [place] [address]      Provider Number: [id number]  Attending Physician Name and Address:  Dereck Leep, MD  Relative Name and Phone Number:       Current Level of Care: Hospital Recommended Level of Care: [place] Prior Approval Number:    Date Approved/Denied:   PASRR Number: [id number])  Discharge Plan: SNF    Current Diagnoses: Patient Active Problem List   Diagnosis Date Noted  . Status post total replacement of hip 10/26/2017  . Primary osteoarthritis of right hip 08/08/2017  . Atherosclerosis of native arteries of extremity with intermittent claudication ([place]) 01/13/2017  . Constipation 07/26/2015  . Tobacco use disorder 07/26/2015  . Insomnia 07/26/2015  . Underweight 07/26/2015  . Ischemic colitis ([place]) 11/07/2014    Orientation RESPIRATION BLADDER Height & Weight     Self, Time, Situation, Place  Normal Continent Weight: 156 lb (70.8 kg) Height:  5\' 6"  (167.6 cm)  BEHAVIORAL SYMPTOMS/MOOD NEUROLOGICAL BOWEL NUTRITION STATUS      Continent Diet(Regular Diet. )  AMBULATORY STATUS COMMUNICATION OF NEEDS Skin   Extensive Assist Verbally Surgical wounds(Incision: Right Hip. )                       Personal Care Assistance Level of Assistance  Bathing, Feeding, Dressing Bathing Assistance: Limited assistance Feeding assistance: Independent Dressing Assistance: Limited assistance     Functional Limitations Info  Sight, Hearing, Speech Sight Info: Adequate Hearing Info: Adequate Speech Info: Adequate    SPECIAL CARE FACTORS FREQUENCY  PT (By licensed PT), OT (By licensed OT)     PT Frequency: (5) OT Frequency: (5)             Contractures      Additional Factors Info  Code Status, Allergies Code Status Info: (Full Code. ) Allergies Info: (Codeine, Morphine And Related)           Current Medications (10/26/2017):  This is the current hospital active medication list Current Facility-Administered Medications  Medication Dose Route Frequency Provider Last Rate Last Dose  . 0.9 %  sodium chloride infusion   Intravenous Continuous Hooten, Laurice Record, MD 100 mL/hr at 10/26/17 1202    . acetaminophen (OFIRMEV) IV 1,000 mg  1,000 mg Intravenous Q6H Hooten, Laurice Record, MD   Stopped at 10/26/17 1347  . acetaminophen (TYLENOL) tablet 650 mg  650 mg Oral Q4H PRN Hooten, Laurice Record, MD       Or  . acetaminophen (TYLENOL) suppository 650 mg  650 mg Rectal Q4H PRN Hooten, Laurice Record, MD      . alum & mag hydroxide-simeth (MAALOX/MYLANTA) 200-200-20 MG/5ML suspension 30 mL  30 mL Oral Q4H PRN Hooten, Laurice Record, MD      . bisacodyl (DULCOLAX) suppository 10 mg  10 mg Rectal Daily PRN Hooten, Laurice Record, MD      . ceFAZolin (ANCEF) IVPB 2g/100 mL premix  2 g Intravenous Q6H Hooten, Laurice Record, MD 200 mL/hr at 10/26/17 1342 2 g at 10/26/17 1342  . celecoxib (CELEBREX) capsule 200 mg  200 mg Oral Q12H Hooten, Laurice Record, MD   200 mg at 10/26/17 1200  . diphenhydrAMINE (BENADRYL) 12.5 MG/5ML elixir  12.5-25 mg  12.5-25 mg Oral Q4H PRN Dereck Leep, MD      . Derrill Memo ON 10/27/2017] enoxaparin (LOVENOX) injection 30 mg  30 mg Subcutaneous Q12H Hooten, Laurice Record, MD      . ferrous sulfate tablet 325 mg  325 mg Oral BID WC Hooten, Laurice Record, MD      . gabapentin (NEURONTIN) capsule 300 mg  300 mg Oral QHS Hooten, Laurice Record, MD      . HYDROmorphone (DILAUDID) injection 0.5 mg  0.5 mg Intravenous Q2H PRN Hooten, Laurice Record, MD      . Derrill Memo ON 10/27/2017] Influenza vac split quadrivalent PF (FLUARIX) injection 0.5 mL  0.5 mL Intramuscular Tomorrow-1000 Hooten, Laurice Record, MD      . magnesium hydroxide (MILK OF MAGNESIA) suspension 30 mL  30 mL Oral Daily  PRN Hooten, Laurice Record, MD   30 mL at 10/26/17 1200  . menthol-cetylpyridinium (CEPACOL) lozenge 3 mg  1 lozenge Oral PRN Hooten, Laurice Record, MD       Or  . phenol (CHLORASEPTIC) mouth spray 1 spray  1 spray Mouth/Throat PRN Hooten, Laurice Record, MD      . metoCLOPramide (REGLAN) tablet 10 mg  10 mg Oral TID AC & HS Hooten, Laurice Record, MD   10 mg at 10/26/17 1200  . ondansetron (ZOFRAN) tablet 4 mg  4 mg Oral Q6H PRN Hooten, Laurice Record, MD       Or  . ondansetron (ZOFRAN) injection 4 mg  4 mg Intravenous Q6H PRN Hooten, Laurice Record, MD      . oxyCODONE (Oxy IR/ROXICODONE) immediate release tablet 5 mg  5 mg Oral Q3H PRN Hooten, Laurice Record, MD   5 mg at 10/26/17 1200  . pantoprazole (PROTONIX) EC tablet 40 mg  40 mg Oral BID Hooten, Laurice Record, MD   40 mg at 10/26/17 1200  . senna-docusate (Senokot-S) tablet 1 tablet  1 tablet Oral BID Hooten, Laurice Record, MD   1 tablet at 10/26/17 1200  . sodium phosphate (FLEET) 7-19 GM/118ML enema 1 enema  1 enema Rectal Once PRN Hooten, Laurice Record, MD      . traMADol Veatrice Bourbon) tablet 50-100 mg  50-100 mg Oral Q4H PRN Hooten, Laurice Record, MD   50 mg at 10/26/17 1344     Discharge Medications: Please see discharge summary for a list of discharge medications.  Relevant Imaging Results:  Relevant Lab Results:   Additional Information (SSN: [id number])  Aithan Farrelly, Veronia Beets, LCSW

## 2017-10-26 NOTE — Anesthesia Procedure Notes (Signed)
Spinal  Patient location during procedure: OR Start time: 10/26/2017 7:12 AM End time: 10/26/2017 7:18 AM Staffing Anesthesiologist: Penwarden, Amy, MD Resident/CRNA: Marion, David, CRNA Performed: resident/CRNA  Preanesthetic Checklist Completed: patient identified, site marked, surgical consent, pre-op evaluation, timeout performed, IV checked, risks and benefits discussed and monitors and equipment checked Spinal Block Patient position: sitting Prep: ChloraPrep Patient monitoring: heart rate, continuous pulse ox, blood pressure and cardiac monitor Approach: midline Location: L4-5 Injection technique: single-shot Needle Needle type: Whitacre and Introducer  Needle gauge: 24 G Needle length: 9 cm Additional Notes Negative paresthesia. Negative blood return. Positive free-flowing CSF. Expiration date of kit checked and confirmed. Patient tolerated procedure well, without complications.       

## 2017-10-26 NOTE — H&P (Signed)
The patient has been re-examined, and the chart reviewed, and there have been no interval changes to the documented history and physical.    The risks, benefits, and alternatives have been discussed at length. The patient expressed understanding of the risks benefits and agreed with plans for surgical intervention.  Yvette Barr, Jr. M.D.    

## 2017-10-26 NOTE — Progress Notes (Addendum)
PT Cancellation Note  Patient Details Name: Yvette Barr MRN: [id number] DOB: [birth year]   Cancelled Treatment:    Reason Eval/Treat Not Completed: Pain limiting ability to participate(Patient initially agreeable to attempting mobility, but after 3-4 reps of R LE ankle pumps and quad sets, reports too much pain to continue. Unable to redirect/encourage.  RN informed/aware; at bedside for medication administration.  Encouraged patient to sit edge of bed this PM with nursing.)  Of note, patient reports she lives with her boyfriend in single-story home with 7-8 steps, bilat rails (too wide), to enter. Ambulatory without assist device at baseline, completing all ADLs without difficulty. Working full-time at [employer].  No recent fall history.   Kiptyn Rafuse H. Owens Shark, PT, DPT, NCS 10/26/17, 11:23 PM [phone]

## 2017-10-26 NOTE — Clinical Social Work Note (Signed)
Clinical Social Work Assessment  Patient Details  Name: Yvette Barr MRN: [id number] Date of Birth: 04-30-1961  Date of referral:  10/26/17               Reason for consult:  Facility Placement                Permission sought to share information with:  Chartered certified accountant granted to share information::  Yes, Verbal Permission Granted  Name::      [place]::   [place]   Relationship::     Contact Information:     Housing/Transportation Living arrangements for the past 2 months:  [place] of Information:  Patient Patient Interpreter Needed:  None Criminal Activity/Legal Involvement Pertinent to Current Situation/Hospitalization:  No - Comment as needed Significant Relationships:  Parents Lives with:  Self Do you feel safe going back to the place where you live?  Yes Need for family participation in patient care:  Yes (Comment)  Care giving concerns:  Patient lives alone in [place].    Social Worker assessment / plan:  Holiday representative (CSW) received SNF consult. PT is pending. Per RN patient wants to go to [facility]. CSW met with patient alone at bedside to discuss D/C plan. Patient was alert and oriented X4 and was laying in the bed. CSW introduced self and explained role of CSW department. Patient reported that she lives alone in [place] and her husband has passed away. Per patient she does not have anybody to take care of her at home and wants to go to [facility] for rehab. Per patient she has spoken with Tammy admissions coordinator at [facility] and made arrangements to go there. CSW explained that patient's insurance [place] Falling will have to approve SNF based on her PT notes. Patient verbalized her understanding. FL2 complete and faxed out. CSW will continue to follow and assist as needed.   Employment status:  Disabled (Comment on whether or not currently receiving Disability) Insurance information:  Managed Care PT  Recommendations:  Not assessed at this time Information / Referral to community resources:  [place]  Patient/Family's Response to care:  Patient prefers to go to [facility] for rehab.   Patient/Family's Understanding of and Emotional Response to Diagnosis, Current Treatment, and Prognosis:  Patient was very pleasant and thanked CSW for assistance.   Emotional Assessment Appearance:  Appears stated age Attitude/Demeanor/Rapport:    Affect (typically observed):  Accepting, Adaptable, Pleasant Orientation:  Oriented to Self, Oriented to Place, Oriented to  Time, Oriented to Situation Alcohol / Substance use:  Not Applicable Psych involvement (Current and /or in the community):  No (Comment)  Discharge Needs  Concerns to be addressed:  Discharge Planning Concerns Readmission within the last 30 days:  No Current discharge risk:  Dependent with Mobility Barriers to Discharge:  Continued Medical Work up   [employer], Veronia Beets, LCSW 10/26/2017, 2:42 PM

## 2017-10-26 NOTE — Transfer of Care (Signed)
Immediate Anesthesia Transfer of Care Note  Patient: Yvette Barr  Procedure(s) Performed: TOTAL HIP ARTHROPLASTY (Right )  Patient Location: PACU  Anesthesia Type:Spinal  Level of Consciousness: awake, alert , oriented and patient cooperative  Airway & Oxygen Therapy: Patient Spontanous Breathing and Patient connected to nasal cannula oxygen  Post-op Assessment: Report given to RN and Post -op Vital signs reviewed and stable  Post vital signs: Reviewed and stable  Last Vitals:  Vitals:   10/26/17 0622  BP: (!) 152/71  Pulse: 61  Resp: 17  Temp: 36.9 C  SpO2: 100%    Last Pain:  Vitals:   10/26/17 0622  TempSrc: Oral  PainSc: 5          Complications: No apparent anesthesia complications

## 2017-10-27 MED ORDER — OXYCODONE HCL 5 MG PO TABS: 5.0000 mg | ORAL_TABLET | ORAL | 0 refills | Status: DC | PRN

## 2017-10-27 MED ORDER — TRAMADOL HCL 50 MG PO TABS: 50.0000 mg | ORAL_TABLET | ORAL | 0 refills | Status: DC | PRN

## 2017-10-27 MED ORDER — ENOXAPARIN SODIUM 30 MG/0.3ML ~~LOC~~ SOLN: 30.0000 mg | Freq: Two times a day (BID) | SUBCUTANEOUS | 0 refills | Status: DC

## 2017-10-27 NOTE — Discharge Summary (Signed)
Physician Discharge Summary  [name] MRN: [id number] DOB/AGE: 01/09/1961 57 y.o.  Admit date: 10/26/2017 Discharge date: 10/28/2017  Admission Diagnoses:  PRIMARY OSTEOARTHRITIS OF RIGHT HIP   Discharge Diagnoses: Patient Active Problem List   Diagnosis Date Noted  . Status post total replacement of hip 10/26/2017  . Primary osteoarthritis of right hip 08/08/2017  . Atherosclerosis of native arteries of extremity with intermittent claudication ([place]) 01/13/2017  . Constipation 07/26/2015  . Tobacco use disorder 07/26/2015  . Insomnia 07/26/2015  . Underweight 07/26/2015  . Ischemic colitis (Yvette Barr) 11/07/2014    Past Medical History:  Diagnosis Date  . Ischemic colitis ([place])      Transfusion: no transfusion during this admission   Consultants (if any):   Discharged Condition: Improved  Hospital Course: Yvette Barr is an 57 y.o. female who was admitted 10/26/2017 with a diagnosis of degenerative arthrosis of right hip and went to the operating room on 10/26/2017 and underwent the above named procedures.    Surgeries:Procedure(s): TOTAL HIP ARTHROPLASTY on 10/26/2017  PRE-OPERATIVE DIAGNOSIS: Degenerative arthrosis of the right hip, primary  POST-OPERATIVE DIAGNOSIS:  Same  PROCEDURE:  Right total hip arthroplasty  SURGEON:  Marciano Sequin. M.D.  ASSISTANT:  Vance Peper, PA (present and scrubbed throughout the case, critical for assistance with exposure, retraction, instrumentation, and closure)  ANESTHESIA: spinal  ESTIMATED BLOOD LOSS: 100 mL  FLUIDS REPLACED: 1000 mL of crystalloid  DRAINS: 2 medium drains to a Hemovac reservoir  IMPLANTS UTILIZED: DePuy 12 mm large stature AML femoral stem, 50 mm OD Pinnacle 100 acetabular component, +4 mm 10 degree Pinnacle Marathon polyethylene insert, and a 32 mm CoCr +1 mm hip ball  INDICATIONS FOR SURGERY: Yvette Barr is a 57 y.o. year old female with a long history of progressive hip and  groin  pain. X-rays demonstrated severe degenerative changes. The patient had not seen any significant improvement despite conservative nonsurgical intervention. After discussion of the risks and benefits of surgical intervention, the patient expressed understanding of the risks benefits and agree with plans for total hip arthroplasty.   The risks, benefits, and alternatives were discussed at length including but not limited to the risks of infection, bleeding, nerve injury, stiffness, blood clots, the need for revision surgery, limb length inequality, dislocation, cardiopulmonary complications, among others, and they were willing to proceed.   Patient tolerated the surgery well. No complications .Patient was taken to PACU where she was stabilized and then transferred to the orthopedic floor.  Patient started on Lovenox 30 q 12 hrs. Foot pumps applied bilaterally at 80 mm hgb. Heels elevated off bed with rolled towels. No evidence of DVT. Calves non tender. Negative [place]. Physical therapy started on day #1 for gait training and transfer with OT starting on  day #1 for ADL and assisted devices. Patient has done well with therapy. Ambulated 90 feet upon being discharged.  Patient's IV And Foley were discontinued on day #1 with Hemovac being discontinued on day #2. Dressing was changed on day 2 prior to patient being discharged   She was given perioperative antibiotics:  Anti-infectives (From admission, onward)   Start     Dose/Rate Route Frequency Ordered Stop   10/26/17 1330  ceFAZolin (ANCEF) IVPB 2g/100 mL premix     2 g 200 mL/hr over 30 Minutes Intravenous Every 6 hours 10/26/17 1131 10/27/17 1329   10/26/17 0600  ceFAZolin (ANCEF) IVPB 2g/100 mL premix     2 g 200 mL/hr over  30 Minutes Intravenous On call to O.R. 10/25/17 2330 10/26/17 0745   10/26/17 0553  ceFAZolin (ANCEF) 2-4 GM/100ML-% IVPB    Comments:  Lyman Bishop   : cabinet override      10/26/17 0553 10/26/17 0734     .  She was fitted with AV 1 compression foot pump devices, instructed on heel pumps, early ambulation, and fitted with TED stockings bilaterally for DVT prophylaxis.  She benefited maximally from the hospital stay and there were no complications.    Recent vital signs:  Vitals:   10/27/17 0407 10/27/17 0744  BP: 97/61 118/68  Pulse: (!) 54 (!) 57  Resp: 18 18  Temp: 98.4 F (36.9 C) 98.3 F (36.8 C)  SpO2: 96% 98%    Recent laboratory studies:  Lab Results  Component Value Date   HGB 13.9 10/14/2017   HGB 12.7 01/25/2016   HGB 13.8 09/14/2014   Lab Results  Component Value Date   WBC 6.3 10/14/2017   PLT 238 10/14/2017   Lab Results  Component Value Date   INR 0.97 10/14/2017   Lab Results  Component Value Date   NA 140 10/14/2017   K 3.6 10/14/2017   CL 106 10/14/2017   CO2 25 10/14/2017   BUN 31 (H) 10/14/2017   CREATININE 0.99 10/14/2017   GLUCOSE 107 (H) 10/14/2017    Discharge Medications:   Allergies as of 10/27/2017      Reactions   Codeine Other (See Comments)   Family history of codeine allergy with rash   Morphine And Related    Family history of morphine reaction with rash      Medication List    TAKE these medications   ADVIL PM 200-38 MG Tabs Generic drug:  Ibuprofen-Diphenhydramine Cit Take 2 tablets by mouth at bedtime.   ARTHRITIS STRENGTH BC POWDER PO Take 1-2 Packages by mouth as needed.   enoxaparin 30 MG/0.3ML injection Commonly known as:  LOVENOX Inject 0.3 mLs (30 mg total) into the skin every 12 (twelve) hours.   oxyCODONE 5 MG immediate release tablet Commonly known as:  Oxy IR/ROXICODONE Take 1 tablet (5 mg total) by mouth every 3 (three) hours as needed for moderate pain ((score 4 to 6)).   senna 8.6 MG tablet Commonly known as:  SENOKOT Take 2 tablets by mouth as needed for constipation.   SUDAFED PE PRESSURE + PAIN PO Take 1 tablet by mouth as needed.   traMADol 50 MG tablet Commonly known as:  ULTRAM Take  1-2 tablets (50-100 mg total) by mouth every 4 (four) hours as needed for moderate pain.            Durable Medical Equipment  (From admission, onward)        Start     Ordered   10/26/17 1132  DME Walker rolling  Once    Question:  Patient needs a walker to treat with the following condition  Answer:  S/P total hip arthroplasty   10/26/17 1131   10/26/17 1132  DME Bedside commode  Once    Question:  Patient needs a bedside commode to treat with the following condition  Answer:  S/P total hip arthroplasty   10/26/17 1131      Diagnostic Studies: Dg Hip Port Unilat With Pelvis 1v Right  Result Date: 10/26/2017 CLINICAL DATA:  57 year old female post total hip replacement. Initial encounter. EXAM: DG HIP (WITH OR WITHOUT PELVIS) 1V PORT RIGHT COMPARISON:  None. FINDINGS: Post right total hip  replacement. Acetabular cup directed anteriorly. No surrounding fracture. Surgical drain in place. Left hip joint degenerative changes. IMPRESSION: Post total right hip replacement as noted above. Electronically Signed   By: Genia Del M.D.   On: 10/26/2017 10:19    Disposition: 01-Home or Self Care  Discharge Instructions    Increase activity slowly   Complete by:  As directed        Contact information for follow-up providers    Dereck Leep, MD On 12/08/2017.   Specialty:  Orthopedic Surgery Why:  at 9:45am Contact information: [address] [facility] [place] [address] [phone]            Contact information for after-discharge care    Destination    [facility] .   Service:  Skilled Nursing Contact information: [address] [place] [phone]                   Signed: Watt Climes. 10/27/2017, 7:50 AM

## 2017-10-27 NOTE — Progress Notes (Signed)
Physical Therapy Treatment Patient Details Name: Yvette Barr MRN: [id number] DOB: January 25, 1961 Today's Date: 10/27/2017    History of Present Illness 57 y/o female s/p R total hip replacement 10/26/17.    PT Comments    Pt agreeable to PT; reports 8/10 pain and is requesting pain medication. Pt requires education on application of all three posterior hip precautions; pt is able to recite all three, but demonstrates no understanding of applying the precautions with function. Pt educated and requires cues throughout session. Pt requires education for improved quality of transfers, gait and bed mobility and less impulsivity during all functional mobility. Pt shows some improvement with education; anticipate pt needing continued education for carryover each session. Continue PT to progress strength, endurance, safety to improve all functional mobility.   Follow Up Recommendations  SNF     Equipment Recommendations  Rolling walker with 5" wheels    Recommendations for Other Services       Precautions / Restrictions Precautions Precautions: Posterior Hip;Fall Precaution Booklet Issued: No Precaution Comments: Pt able to recall all three but does not apply well. Needs education on application and reminder cues with functional activity Restrictions Weight Bearing Restrictions: Yes RLE Weight Bearing: Weight bearing as tolerated    Mobility  Bed Mobility Overal bed mobility: Needs Assistance Bed Mobility: Sit to Supine       Sit to supine: Min assist   General bed mobility comments: for RLE  Transfers Overall transfer level: Needs assistance Equipment used: Rolling walker (2 wheeled) Transfers: Sit to/from Stand Sit to Stand: Min assist         General transfer comment: Cues for proper hand placement. Attempts several times impulsively without correct hand placement and void of attention to posterior hip precautions.   Ambulation/Gait Ambulation/Gait assistance: Min  guard Ambulation Distance (Feet): 25 Feet(second walk 60) Assistive device: Rolling walker (2 wheeled) Gait Pattern/deviations: Step-through pattern;Decreased stance time - right;Decreased stride length;Decreased weight shift to right(stiff leg on the R with swing phase)   Gait velocity interpretation: Below normal speed for age/gender General Gait Details: Initial ambulating mildly impulsive and poor sequence and use of RLE. Instructed in slower speed with improved reciprocal pattern and hip/knee flexion on swing phase. Also insturcted on attention to "toes in direction of nose" on turns    Stairs            Wheelchair Mobility    Modified Rankin (Stroke Patients Only)       Balance Overall balance assessment: Needs assistance Sitting-balance support: Feet supported Sitting balance-Leahy Scale: Good     Standing balance support: Bilateral upper extremity supported Standing balance-Leahy Scale: Fair                              Cognition Arousal/Alertness: Awake/alert Behavior During Therapy: WFL for tasks assessed/performed Overall Cognitive Status: Within Functional Limits for tasks assessed                                        Exercises Total Joint Exercises Ankle Circles/Pumps: AROM;Both;20 reps;Supine Quad Sets: Strengthening;Both;10 reps;Supine(also in stand prior to gait with weight shift) Gluteal Sets: Strengthening;Both;10 reps;Supine Heel Slides: AAROM;Right;10 reps;Supine Hip ABduction/ADduction: AAROM;Right;10 reps;Supine Other Exercises Other Exercises: pt educated in posterior total hip precautions and techniques for maintaining posterior THPs during self care tasks and functional transfers with pt verbalizing understanding,  but declining to trial herself.    General Comments        Pertinent Vitals/Pain Pain Assessment: 0-10 Pain Score: 8  Pain Location: R hip, groin and buttock Pain Descriptors / Indicators:  Constant;Aching;Operative site guarding;Sharp Pain Intervention(s): Limited activity within patient's tolerance;Monitored during session;Patient requesting pain meds-RN notified    Home Living Family/patient expects to be discharged to:: Skilled nursing facility Living Arrangements: Alone                  Prior Function Level of Independence: Independent      Comments: Pt works at a cigarette factory, on feet much of the day, active   PT Goals (current goals can now be found in the care plan section) Acute Rehab PT Goals Patient Stated Goal: get stronger at rehab Progress towards PT goals: Progressing toward goals    Frequency    BID      PT Plan Current plan remains appropriate    Co-evaluation              AM-PAC PT "6 Clicks" Daily Activity  Outcome Measure  Difficulty turning over in bed (including adjusting bedclothes, sheets and blankets)?: Unable Difficulty moving from lying on back to sitting on the side of the bed? : Unable Difficulty sitting down on and standing up from a chair with arms (e.g., wheelchair, bedside commode, etc,.)?: Unable Help needed moving to and from a bed to chair (including a wheelchair)?: A Little Help needed walking in hospital room?: A Little Help needed climbing 3-5 steps with a railing? : A Lot 6 Click Score: 11    End of Session Equipment Utilized During Treatment: Gait belt Activity Tolerance: Patient tolerated treatment well;Patient limited by pain Patient left: in bed;with call bell/phone within reach;with bed alarm set   PT Visit Diagnosis: Muscle weakness (generalized) (M62.81);Difficulty in walking, not elsewhere classified (R26.2)     Time: 9381-8299 PT Time Calculation (min) (ACUTE ONLY): 31 min  Charges:  $Gait Training: 8-22 mins $Therapeutic Exercise: 8-22 mins                    G Codes:        Larae Grooms, PTA 10/27/2017, 3:40 PM

## 2017-10-27 NOTE — Progress Notes (Signed)
Per [facility] liaison [facility] authorization is still pending and [facility] has not requested clinicals from [facility]. Clinical Social Worker (CSW) met with patient and made her aware of above. CSW discussed with patient a back up plan if patient is stable for discharge and [facility] has not made a decision about paying for SNF. Patient reported that she would be willing to pay 5 days up front to [facility] ($270 per day) $1,350 total. Patient reported that she would call her HR representative to see if they can get in contact with [employer]. CSW explained that if [employer] authorization is pending at time of discharge then patient will have to pay the 5 days up front to go to [facility] or she will have to D/C home and wait for [employer]'s decision. Patient verbalized her understanding. [facility] liaison is aware of above.   [employer], LCSW [phone]

## 2017-10-27 NOTE — Progress Notes (Signed)
Pt refused to be dangled at bedside. Pain controlled with medication. Foley removed this am at 0615. Large urine output. Dsg dry and intact. Encouraged to use incentive spirometer. Blood pressure on the low side

## 2017-10-27 NOTE — Progress Notes (Signed)
ORTHOPAEDICS PROGRESS NOTE  PATIENT NAME: Yvette Barr DOB: Mar 17, 1961  MRN: [id number]  POD # 1: Right total hip arthroplasty  Subjective: The patient is resting comfortably.  Pain has been well controlled with tramadol. The patient wants to go to [place] for rehabilitation.  Objective: Vital signs in last 24 hours: Temp:  [97.3 F (36.3 C)-98.9 F (37.2 C)] 98.4 F (36.9 C) (01/29 0407) Pulse Rate:  [43-71] 54 (01/29 0407) Resp:  [13-21] 18 (01/29 0407) BP: (89-129)/(53-75) 97/61 (01/29 0407) SpO2:  [94 %-100 %] 96 % (01/29 0407)  Intake/Output from previous day: 01/28 0701 - 01/29 0700 In: 3991.7 [P.O.:840; I.V.:3121.7] Out: 8676 [Urine:3750; Drains:50; Blood:130]  No results for input(s): WBC, HGB, HCT, PLT, K, CL, CO2, BUN, CREATININE, GLUCOSE, CALCIUM, LABPT, INR in the last 72 hours.  EXAM General: Well-developed well-nourished female seen in no apparent discomfort Lungs: clear to auscultation Cardiac: normal rate and regular rhythm Right lower extremity: Dressing is dry and intact.  Hemovac drain is in place.  No significant swelling or ecchymosis.  Homans test is negative. Neurologic: Sensory and motor function are grossly intact.   Assessment: Right total hip arthroplasty  Secondary diagnoses: History of ischemic colitis  Plan: Today's goal were reviewed with the patient.  Continue as per total hip rehab protocol.  Posterior precautions. Plan is to go Skilled nursing facility after hospital stay.  FL-2 has been completed. DVT Prophylaxis - Lovenox, Foot Pumps and TED hose  Aleathea Pugmire P. Holley Bouche M.D.

## 2017-10-27 NOTE — Progress Notes (Signed)
Clinical Education officer, museum (CSW) sent PT note to [facility] via [facility]. Per [facility] authorization has been started.   [employer], LCSW [phone]

## 2017-10-27 NOTE — Evaluation (Signed)
Physical Therapy Evaluation Patient Details Name: Yvette Barr MRN: [id number] DOB: [birth year] Today's Date: 10/27/2017   History of Present Illness  57 y/o female s/p R total hip replacement 10/26/17.  Clinical Impression  Pt was able to participate with PT relatively well, but was pain limited, fearful and guarded with all tasks.  She also displayed R hip weakness that did not allow for any AROM against gravity and she needed assist to even slide LE in bed minimally.  Pt normally very active but is anxious about being able to go home and per today's performance she was unable to do transfers, and struggled with minimal ambulation, rehab is the safest/most appropriate option at this point.     Follow Up Recommendations SNF    Equipment Recommendations  Rolling walker with 5" wheels    Recommendations for Other Services       Precautions / Restrictions Precautions Precautions: Posterior Hip;Fall Restrictions RLE Weight Bearing: Weight bearing as tolerated      Mobility  Bed Mobility Overal bed mobility: Needs Assistance Bed Mobility: Supine to Sit     Supine to sit: Min assist     General bed mobility comments: Pt struggled to get to sitting EOB, unable to actively move R LE toward EOB, also needed assist to elevate torso to sitting  Transfers Overall transfer level: Needs assistance Equipment used: Rolling walker (2 wheeled) Transfers: Sit to/from Stand Sit to Stand: Min assist         General transfer comment: Pt needed encouragement to get to standing, ultimately did not need heavy assist, but some tactile cues to keep weight forward on walker and for confidence  Ambulation/Gait Ambulation/Gait assistance: Min assist Ambulation Distance (Feet): 25 Feet Assistive device: Rolling walker (2 wheeled)       General Gait Details: Pt hesitant with WBing and generally with ambulation and was highly reliant on the walker as well as very guarded.  She did show some  improvement with assist/cuing/gait training  Stairs            Wheelchair Mobility    Modified Rankin (Stroke Patients Only)       Balance Overall balance assessment: Needs assistance Sitting-balance support: Bilateral upper extremity supported Sitting balance-Leahy Scale: Good     Standing balance support: Bilateral upper extremity supported Standing balance-Leahy Scale: Fair                               Pertinent Vitals/Pain Pain Assessment: 0-10 Pain Score: 7     Home Living Family/patient expects to be discharged to:: Skilled nursing facility Living Arrangements: Alone                    Prior Function Level of Independence: Independent         Comments: Pt works, on feet much of the day, active     Hand Dominance        Extremity/Trunk Assessment   Upper Extremity Assessment Upper Extremity Assessment: Overall WFL for tasks assessed    Lower Extremity Assessment Lower Extremity Assessment: Generalized weakness(expected post-op weakness in L hip)       Communication   Communication: No difficulties  Cognition Arousal/Alertness: Awake/alert Behavior During Therapy: WFL for tasks assessed/performed Overall Cognitive Status: Within Functional Limits for tasks assessed  General Comments      Exercises Total Joint Exercises Ankle Circles/Pumps: AROM;10 reps Quad Sets: Strengthening;10 reps Gluteal Sets: Strengthening;10 reps Short Arc Quad: Strengthening;10 reps Heel Slides: AAROM;10 reps Hip ABduction/ADduction: AAROM;10 reps Straight Leg Raises: PROM;5 reps   Assessment/Plan    PT Assessment Patient needs continued PT services  PT Problem List Decreased strength;Decreased range of motion;Decreased activity tolerance;Decreased balance;Decreased mobility;Decreased coordination;Decreased knowledge of use of DME;Decreased safety awareness;Decreased knowledge of  precautions       PT Treatment Interventions DME instruction;Gait training;Stair training;Functional mobility training;Therapeutic activities;Therapeutic exercise;Balance training;Neuromuscular re-education;Patient/family education    PT Goals (Current goals can be found in the Care Plan section)  Acute Rehab PT Goals Patient Stated Goal: get stronger at rehab PT Goal Formulation: With patient Time For Goal Achievement: 11/10/17 Potential to Achieve Goals: Fair    Frequency Min 2X/week   Barriers to discharge        Co-evaluation               AM-PAC PT "6 Clicks" Daily Activity  Outcome Measure Difficulty turning over in bed (including adjusting bedclothes, sheets and blankets)?: Unable Difficulty moving from lying on back to sitting on the side of the bed? : Unable Difficulty sitting down on and standing up from a chair with arms (e.g., wheelchair, bedside commode, etc,.)?: Unable Help needed moving to and from a bed to chair (including a wheelchair)?: A Little Help needed walking in hospital room?: A Lot Help needed climbing 3-5 steps with a railing? : A Lot 6 Click Score: 10    End of Session Equipment Utilized During Treatment: Gait belt Activity Tolerance: Patient tolerated treatment well Patient left: with chair alarm set;with call bell/phone within reach Nurse Communication: Mobility status PT Visit Diagnosis: Muscle weakness (generalized) (M62.81);Difficulty in walking, not elsewhere classified (R26.2)    Time: 8466-5993 PT Time Calculation (min) (ACUTE ONLY): 26 min   Charges:   PT Evaluation $PT Eval Low Complexity: 1 Low PT Treatments $Therapeutic Exercise: 8-22 mins   PT G Codes:        Kreg Shropshire, DPT 10/27/2017, 11:33 AM

## 2017-10-27 NOTE — Anesthesia Postprocedure Evaluation (Signed)
Anesthesia Post Note  Patient: Yvette Barr  Procedure(s) Performed: TOTAL HIP ARTHROPLASTY (Right )  Patient location during evaluation: Nursing Unit Anesthesia Type: Spinal Level of consciousness: awake and alert and oriented Pain management: satisfactory to patient Vital Signs Assessment: post-procedure vital signs reviewed and stable Respiratory status: respiratory function stable Cardiovascular status: stable Postop Assessment: no backache, no headache, spinal receding, no apparent nausea or vomiting, patient able to bend at knees and adequate PO intake Anesthetic complications: no     Last Vitals:  Vitals:   10/27/17 0130 10/27/17 0407  BP: (!) 89/55 97/61  Pulse: (!) 57 (!) 54  Resp: 18 18  Temp: 37.2 C 36.9 C  SpO2: 97% 96%    Last Pain:  Vitals:   10/27/17 0611  TempSrc:   PainSc: 8                  Blima Singer

## 2017-10-27 NOTE — Evaluation (Signed)
Occupational Therapy Evaluation Patient Details Name: Yvette Barr MRN: [id number] DOB: [birth year] Today's Date: 10/27/2017    History of Present Illness 57 y/o female s/p R total hip replacement 10/26/17.   Clinical Impression   Pt seen for OT evaluation this date. Pt is POD#1 s/p R THR with posterior total hip precautions and WBAT on RLE. Pt states she lives by herself and does not have reliable help at home. Pt was independent in all ADLs, driving, and working full time  prior to surgery and is eager to return to [facility].  Pt is currently limited in functional LB ADLs due to pain and decreased strength/ROM.  Pt requires min-mod assist for LB dressing and bathing skills due to pain and decreased AROM of RLE. Pt only able to recall 1/3 posterior THPs at start of session. Pt educated in AE/DME for LB ADL and posterior total hip precautions and techniques for maintaining posterior THPs during self care tasks and functional transfers with pt verbalizing understanding, but declining to trial herself due to having "a lot on my mind." Pt will benefit from continued skilled OT services for education in assistive devices, functional mobility, and education in recommendations for home modifications to increase safety and prevent falls.  Pt is a good candidate for SNF to continue rehabilitation.      Follow Up Recommendations  SNF    Equipment Recommendations  3 in 1 bedside commode;Other (comment)(reacher)    Recommendations for Other Services       Precautions / Restrictions Precautions Precautions: Posterior Hip;Fall Precaution Booklet Issued: No Precaution Comments: pt able to recall 1/3 posterior THPs at start of session (no crossing legs), educated in precautions, still only able to recall 1/3 at end of session (no crossing legs), requiring verbal cues to recall other 2 (no bending past 90, no internal rotation) Restrictions Weight Bearing Restrictions: Yes RLE Weight Bearing: Weight bearing as  tolerated      Mobility Bed Mobility      General bed mobility comments: deferred, pt in recliner  Transfers          General transfer comment: pt declined despite encouragement due to having "a lot on my mind"    Balance Overall balance assessment: Needs assistance Sitting-balance support: Bilateral upper extremity supported Sitting balance-Leahy Scale: Good                                 ADL either performed or assessed with clinical judgement   ADL Overall ADL's : Needs assistance/impaired Eating/Feeding: Sitting;Independent   Grooming: Sitting;Independent   Upper Body Bathing: Sitting;Set up;Supervision/ safety   Lower Body Bathing: Minimal assistance;Moderate assistance;Sitting/lateral leans   Upper Body Dressing : Sitting;Set up;Supervision/safety   Lower Body Dressing: Sitting/lateral leans;Minimal assistance;Moderate assistance Lower Body Dressing Details (indicate cue type and reason): pt educated in AE for LB dressing, pt declined to trial herself due to having "a lot on my mindScientist, research (life sciences) Details (indicate cue type and reason): pt declined due to having "a lot on my mind"                 Vision Baseline Vision/History: Wears glasses Wears Glasses: At all times Patient Visual Report: No change from baseline       Perception     Praxis      Pertinent Vitals/Pain Pain Assessment: 0-10 Pain Score: 7  Pain Location: R groin Pain Descriptors / Indicators: Aching Pain  Intervention(s): Limited activity within patient's tolerance;Monitored during session;Premedicated before session     Hand Dominance     Extremity/Trunk Assessment Upper Extremity Assessment Upper Extremity Assessment: Overall WFL for tasks assessed   Lower Extremity Assessment Lower Extremity Assessment: Generalized weakness;Defer to PT evaluation   Cervical / Trunk Assessment Cervical / Trunk Assessment: Normal   Communication  Communication Communication: No difficulties   Cognition Arousal/Alertness: Awake/alert Behavior During Therapy: WFL for tasks assessed/performed Overall Cognitive Status: Within Functional Limits for tasks assessed                                     General Comments       Exercises Other Exercises Other Exercises: pt educated in posterior total hip precautions and techniques for maintaining posterior THPs during self care tasks and functional transfers with pt verbalizing understanding, but declining to trial herself.   Shoulder Instructions      Home Living Family/patient expects to be discharged to:: Skilled nursing facility Living Arrangements: Alone                                      Prior Functioning/Environment Level of Independence: Independent        Comments: Pt works at a cigarette factory, on feet much of the day, active        OT Problem List: Decreased knowledge of use of DME or AE;Decreased strength;Decreased range of motion;Decreased knowledge of precautions;Decreased activity tolerance;Pain      OT Treatment/Interventions: Self-care/ADL training;Balance training;Therapeutic exercise;Therapeutic activities;DME and/or AE instruction;Patient/family education    OT Goals(Current goals can be found in the care plan section) Acute Rehab OT Goals Patient Stated Goal: get stronger at rehab OT Goal Formulation: With patient Time For Goal Achievement: 11/10/17 Potential to Achieve Goals: Good ADL Goals Pt Will Perform Lower Body Dressing: with set-up;with supervision;sit to/from stand;with adaptive equipment(maintaining posterior THPs) Pt Will Transfer to Toilet: with min guard assist;ambulating(maintaining post. THPs, comfort height toilet, LRAD for amb) Additional ADL Goal #1: Pt will verbalize 3/3 posterior THPs with no verbal or visual cues in order to maximize safety.  OT Frequency: Min 2X/week   Barriers to D/C:             Co-evaluation              AM-PAC PT "6 Clicks" Daily Activity     Outcome Measure Help from another person eating meals?: None Help from another person taking care of personal grooming?: None Help from another person toileting, which includes using toliet, bedpan, or urinal?: A Little Help from another person bathing (including washing, rinsing, drying)?: A Lot Help from another person to put on and taking off regular upper body clothing?: None Help from another person to put on and taking off regular lower body clothing?: A Lot 6 Click Score: 19   End of Session    Activity Tolerance: Patient tolerated treatment well Patient left: in chair;with call bell/phone within reach;with chair alarm set  OT Visit Diagnosis: Other abnormalities of gait and mobility (R26.89);Pain Pain - Right/Left: Right Pain - part of body: Hip                Time: 4098-1191 OT Time Calculation (min): 15 min Charges:  OT General Charges $OT Visit: 1 Visit OT Evaluation $OT Eval Low Complexity: 1 Low  Jeni Salles, MPH, MS, OTR/L ascom [phone] 10/27/17, 12:11 PM

## 2017-10-28 LAB — SURGICAL PATHOLOGY

## 2017-10-28 NOTE — Progress Notes (Signed)
Patient is medically stable for D/C to [facility] today. Per [facility] authorization has been received and patient can come today to room 810. RN will call report to [facility] at [phone] and arrange EMS for transport. Clinical Education officer, museum (CSW) sent D/C orders to [facility] via [facility]. Patient is aware of above. CSW contacted patient's mother [name] and made her aware of above. Please reconsult if future social work needs arise. CSW signing off.   [employer], LCSW [phone]

## 2017-10-28 NOTE — Clinical Social Work Placement (Signed)
   CLINICAL SOCIAL WORK PLACEMENT  NOTE  Date:  10/28/2017  Patient Details  Name: Yvette Barr MRN: [id number] Date of Birth: [birth year]  Clinical Social Work is seeking post-discharge placement for this patient at the [place] level of care (*CSW will initial, date and re-position this form in  chart as items are completed):  Yes   Patient/family provided with [facility]'s list of facilities offering this level of care within the geographic area requested by the patient (or if unable, by the patient's family).  Yes   Patient/family informed of their freedom to choose among providers that offer the needed level of care, that participate in Medicare, Medicaid or managed care program needed by the patient, have an available bed and are willing to accept the patient.  Yes   Patient/family informed of [facility]'s ownership interest in [facility] and [facility], as well as of the fact that they are under no obligation to receive care at these facilities.  PASRR submitted to EDS on 10/26/17     PASRR number received on 10/26/17     Existing PASRR number confirmed on       FL2 transmitted to all facilities in geographic area requested by pt/family on 10/26/17     FL2 transmitted to all facilities within larger geographic area on       Patient informed that his/her managed care company has contracts with or will negotiate with certain facilities, including the following:        Yes   Patient/family informed of bed offers received.  [facility]     Physician recommends and patient chooses bed at      Patient to be transferred to ([facility] ) on 10/28/17.  Patient to be transferred to facility by [facility] )     Patient family notified on 10/28/17 of transfer.  Name of family member notified:  (Patient's mother [name] is aware of D/C today. )     PHYSICIAN       Additional Comment:     _______________________________________________ Cristal Qadir, Veronia Beets, LCSW 10/28/2017, 1:52 PM

## 2017-10-28 NOTE — Progress Notes (Signed)
Physical Therapy Treatment Patient Details Name: Yvette Barr MRN: [id number] DOB: [birth year] Today's Date: 10/28/2017    History of Present Illness 57 y/o female s/p R total hip replacement 10/26/17.    PT Comments    Pt is making good progress towards goals with improved ambulation distance this session. Still needs cga for mobility as she is forgetful of hip precautions and unsafe at times. Able to perform there-ex, educated on written HEP. Appears motivated to perform therapy. Will continue to progress.   Follow Up Recommendations  SNF     Equipment Recommendations  Rolling walker with 5" wheels    Recommendations for Other Services       Precautions / Restrictions Precautions Precautions: Posterior Hip;Fall Precaution Booklet Issued: Yes (comment) Precaution Comments: Pt able to recall 2/3 hip precautions Restrictions Weight Bearing Restrictions: Yes RLE Weight Bearing: Weight bearing as tolerated    Mobility  Bed Mobility Overal bed mobility: Needs Assistance Bed Mobility: Supine to Sit     Supine to sit: Min assist     General bed mobility comments: improved ability to follow commands and not impulsive. Needs assist for R LE. Once seated at EOB, needs reminders of post hip precautions.  Transfers Overall transfer level: Needs assistance Equipment used: Rolling walker (2 wheeled) Transfers: Sit to/from Stand Sit to Stand: Min assist         General transfer comment: cues for hand placement prior to standing. RW used.  Ambulation/Gait Ambulation/Gait assistance: Min guard Ambulation Distance (Feet): 90 Feet Assistive device: Rolling walker (2 wheeled) Gait Pattern/deviations: Step-through pattern;Decreased stance time - right;Decreased stride length;Decreased weight shift to right     General Gait Details: ambulating with RW in hallway with step to gait progressing to reciprocal gait pattern. Needs mod cues for maintaining hip precautions during  mobility efforts. Fatigues with increased distance   Stairs            Wheelchair Mobility    Modified Rankin (Stroke Patients Only)       Balance                                            Cognition Arousal/Alertness: Awake/alert Behavior During Therapy: WFL for tasks assessed/performed Overall Cognitive Status: Within Functional Limits for tasks assessed                                        Exercises Other Exercises Other Exercises: Continued education given on hip precautions. Ther-ex performed on R LE including ankle pumps, quad sets, glut sets, and SAQ. ALl ther-ex performed x 15 reps and reviewed written HEP. Min assist given for ther-ex    General Comments        Pertinent Vitals/Pain Pain Assessment: 0-10 Pain Score: 7  Pain Location: R hip Pain Descriptors / Indicators: Operative site guarding Pain Intervention(s): Limited activity within patient's tolerance;Premedicated before session    Home Living                      Prior Function            PT Goals (current goals can now be found in the care plan section) Acute Rehab PT Goals Patient Stated Goal: get stronger at rehab PT Goal Formulation: With patient Time For  Goal Achievement: 11/10/17 Potential to Achieve Goals: Good Progress towards PT goals: Progressing toward goals    Frequency    BID      PT Plan Current plan remains appropriate    Co-evaluation              AM-PAC PT "6 Clicks" Daily Activity  Outcome Measure  Difficulty turning over in bed (including adjusting bedclothes, sheets and blankets)?: Unable Difficulty moving from lying on back to sitting on the side of the bed? : Unable Difficulty sitting down on and standing up from a chair with arms (e.g., wheelchair, bedside commode, etc,.)?: Unable Help needed moving to and from a bed to chair (including a wheelchair)?: A Little Help needed walking in hospital room?: A  Little Help needed climbing 3-5 steps with a railing? : A Lot 6 Click Score: 11    End of Session Equipment Utilized During Treatment: Gait belt Activity Tolerance: Patient tolerated treatment well;Patient limited by pain Patient left: in bed;with bed alarm set(wants to sit in recliner later after bath) Nurse Communication: Mobility status PT Visit Diagnosis: Muscle weakness (generalized) (M62.81);Difficulty in walking, not elsewhere classified (R26.2)     Time: 6553-7482 PT Time Calculation (min) (ACUTE ONLY): 23 min  Charges:  $Gait Training: 8-22 mins $Therapeutic Exercise: 8-22 mins                    G Codes:       Greggory Stallion, PT, DPT [phone]    Kamy Poinsett 10/28/2017, 10:11 AM

## 2017-10-28 NOTE — Progress Notes (Signed)
   Subjective: 2 Days Post-Op Procedure(s) (LRB): TOTAL HIP ARTHROPLASTY (Right) Patient reports pain as moderate.   Patient is well, and has had no acute complaints or problems Pt did poor with therapy yesterday. Ambulated 25 ft x 2 Plan is to go Rehab after hospital stay. no nausea and no vomiting Patient denies any chest pains or shortness of breath. Objective: Vital signs in last 24 hours: Temp:  [98 F (36.7 C)-99.3 F (37.4 C)] 99.3 F (37.4 C) (01/30 0324) Pulse Rate:  [57-73] 73 (01/30 0324) Resp:  [18-19] 19 (01/30 0324) BP: (93-118)/(57-68) 107/67 (01/30 0324) SpO2:  [92 %-98 %] 92 % (01/30 0324) well approximated incision Heels are non tender and elevated off the bed using rolled towels Intake/Output from previous day: 01/29 0701 - 01/30 0700 In: 1316.7 [P.O.:240; I.V.:1076.7] Out: 200 [Drains:200] Intake/Output this shift: No intake/output data recorded.  No results for input(s): HGB in the last 72 hours. No results for input(s): WBC, RBC, HCT, PLT in the last 72 hours. No results for input(s): NA, K, CL, CO2, BUN, CREATININE, GLUCOSE, CALCIUM in the last 72 hours. No results for input(s): LABPT, INR in the last 72 hours.  EXAM General - Patient is Alert, Appropriate and Oriented Extremity - Neurologically intact Neurovascular intact Sensation intact distally Intact pulses distally Dorsiflexion/Plantar flexion intact No cellulitis present Compartment soft Dressing - dressing C/D/I Motor Function - intact, moving foot and toes well on exam.    Past Medical History:  Diagnosis Date  . Ischemic colitis ([place])     Assessment/Plan: 2 Days Post-Op Procedure(s) (LRB): TOTAL HIP ARTHROPLASTY (Right) Active Problems:   Status post total replacement of hip  Estimated body mass index is 25.18 kg/m as calculated from the following:   Height as of this encounter: 5\' 6"  (1.676 m).   Weight as of this encounter: 70.8 kg (156 lb). Up with therapy Plan for  discharge tomorrow Discharge to SNF  Labs: none DVT Prophylaxis - Lovenox, Foot Pumps and TED hose Weight-Bearing as tolerated to right leg Hemovac d/c'd. End of drains appeared to be intact  Jon R. Dover Patterson 10/28/2017, 7:19 AM

## 2017-10-28 NOTE — Progress Notes (Signed)
Pt ready for d/c to SNF today per MD. Report called to [place], nurse at [facility], all questions answered. EMS transportation set up for pt. VSS, in NAD, PIV removed. All belongings packed.   [place], Jerry Caras

## 2017-11-04 ENCOUNTER — Other Ambulatory Visit
Admission: RE | Admit: 2017-11-04 | Discharge: 2017-11-04 | Disposition: A | Discharge status: Home / Self Care | Payer: 59 | Source: Ambulatory Visit | Attending: Family Medicine | Admitting: Family Medicine

## 2017-11-04 DIAGNOSIS — B379 Candidiasis, unspecified: Secondary | ICD-10-CM | POA: Diagnosis present

## 2017-11-04 LAB — WET PREP, GENITAL
SPERM: NONE SEEN
TRICH WET PREP: NONE SEEN
YEAST WET PREP: NONE SEEN

## 2017-12-02 ENCOUNTER — Encounter: Payer: Self-pay | Admitting: Internal Medicine

## 2017-12-02 ENCOUNTER — Ambulatory Visit: Payer: 59 | Admitting: Internal Medicine

## 2017-12-02 VITALS — BP 118/60 | HR 66 | Ht 66.0 in | Wt 159.0 lb

## 2017-12-02 DIAGNOSIS — Z96641 Presence of right artificial hip joint: Secondary | ICD-10-CM

## 2017-12-02 DIAGNOSIS — F17200 Nicotine dependence, unspecified, uncomplicated: Secondary | ICD-10-CM | POA: Diagnosis not present

## 2017-12-02 MED ORDER — TRAMADOL HCL 50 MG PO TABS: 50.0000 mg | ORAL_TABLET | Freq: Two times a day (BID) | ORAL | 0 refills | Status: DC | PRN

## 2017-12-02 NOTE — Progress Notes (Signed)
Date:  12/02/2017   Name:  Yvette Barr   DOB:  [birth year]   MRN:  [id number]   Chief Complaint: Hospitalization Follow-up (F/up for [facility] Hip Pain   There was no injury mechanism (had hip replacement 10/26/17.). The pain is present in the right hip. The pain is mild. The pain has been improving since onset.  She did well in rehab and has been home for almost three weeks.  She has not started driving yet.  She is considering when she can go back to work.  I advised her to wait until appt with Ortho the get specific activity instructions.  She was taking Tramadol and would like a few more to take for severe pain.   Review of Systems  Constitutional: Negative for chills, fatigue and fever.  Respiratory: Negative for cough, chest tightness, shortness of breath and wheezing.   Cardiovascular: Negative for chest pain, palpitations and leg swelling.  Musculoskeletal: Positive for arthralgias. Negative for back pain.    Patient Active Problem List   Diagnosis Date Noted  . Status post total replacement of hip 10/26/2017  . Primary osteoarthritis of right hip 08/08/2017  . Atherosclerosis of native arteries of extremity with intermittent claudication ([place]) 01/13/2017  . Constipation 07/26/2015  . Tobacco use disorder 07/26/2015  . Insomnia 07/26/2015  . Underweight 07/26/2015  . Ischemic colitis ([place]) 11/07/2014    Prior to Admission medications   Medication Sig Start Date End Date Taking? Authorizing Provider  traMADol (ULTRAM) 50 MG tablet Take 1-2 tablets (50-100 mg total) by mouth every 4 (four) hours as needed for moderate pain. Patient not taking: Reported on 12/02/2017 10/27/17   Watt Climes, PA    Allergies  Allergen Reactions  . Codeine Other (See Comments)    Family history of codeine allergy with rash  . Morphine And Related     Family history of morphine reaction with rash    Past Surgical History:  Procedure Laterality Date  . COLONOSCOPY    . NO PAST  SURGERIES    . SIGMOIDOSCOPY    . TOTAL HIP ARTHROPLASTY Right 10/26/2017   Procedure: TOTAL HIP ARTHROPLASTY;  Surgeon: Dereck Leep, MD;  Location: [facility] ORS;  Service: Orthopedics;  Laterality: Right;    Social History   Tobacco Use  . Smoking status: Current Every Day Smoker    Packs/day: 0.10    Types: Cigarettes  . Smokeless tobacco: Never Used  . Tobacco comment: 2 cigerettes a day  Substance Use Topics  . Alcohol use: Yes    Alcohol/week: 1.2 oz    Types: 2 Cans of beer per week    Comment: occasional  . Drug use: No     Medication list has been reviewed and updated.  PHQ 2/9 Scores 12/02/2017 05/29/2015  PHQ - 2 Score 0 0  PHQ- 9 Score 0 -    Physical Exam  Constitutional: She is oriented to person, place, and time. She appears well-developed. No distress.  HENT:  Head: Normocephalic and atraumatic.  Neck: Normal range of motion. Neck supple. No thyromegaly present.  Cardiovascular: Normal rate, regular rhythm and normal heart sounds.  Pulmonary/Chest: Effort normal. No respiratory distress. She has no wheezes. She has no rales.  Neurological: She is alert and oriented to person, place, and time.  Skin: Skin is warm and dry. No rash noted.     Psychiatric: She has a normal mood and affect. Her behavior is normal. Thought content normal.  Nursing  note and vitals reviewed.   BP 118/60   Pulse 66   Ht 5\' 6"  (1.676 m)   Wt 159 lb (72.1 kg)   SpO2 100%   BMI 25.66 kg/m   Assessment and Plan: 1. S/P hip replacement, right Doing well Tramadol 50 mg #15 given Follow up with Ortho  2. Tobacco use disorder Doing well cutting back - now only 2 per day Pt encouraged to continue efforts to quit completely   No orders of the defined types were placed in this encounter.   Partially dictated using Editor, commissioning. Any errors are unintentional.  Halina Maidens, MD [facility]  12/02/2017

## 2018-07-05 ENCOUNTER — Encounter: Payer: Self-pay | Admitting: Podiatry

## 2018-07-05 ENCOUNTER — Ambulatory Visit: Payer: 59 | Admitting: Podiatry

## 2018-07-05 DIAGNOSIS — M79674 Pain in right toe(s): Secondary | ICD-10-CM | POA: Diagnosis not present

## 2018-07-05 DIAGNOSIS — B351 Tinea unguium: Secondary | ICD-10-CM

## 2018-07-05 DIAGNOSIS — L608 Other nail disorders: Secondary | ICD-10-CM | POA: Diagnosis not present

## 2018-07-05 DIAGNOSIS — M79675 Pain in left toe(s): Secondary | ICD-10-CM

## 2018-07-05 NOTE — Progress Notes (Signed)
This patient presents to this office with severe pain noted along the inside border left big toe.  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 She says that the pain has become very severe over the last 2 weeks.  She denies any drainage or infection at the site of the pain of the left big toe.  Patient states that she previously had this same problem and was treated with surgery on her right big toe  by Drs. Hyatt and [employer].  She presents the office today for an evaluation and treatment of this painful ingrowing nail.  Vascular  Dorsalis pedis and posterior tibial pulses are palpable  B/L.  Capillary return  WNL.  Temperature gradient is  WNL.  Skin turgor  WNL  Sensorium  Senn Weinstein monofilament wire  WNL. Normal tactile sensation.  Nail Exam  Patient has marked incurvation noted left great toenail.  No redness or swelling noted.  Pincer hallux nail noted left hallux.  Orthopedic  Exam  Muscle tone and muscle strength  WNL.  No limitations of motion feet  B/L.  No crepitus or joint effusion noted.  Foot type is unremarkable and digits show no abnormalities.  Bony prominences are unremarkable.  Skin  No open lesions.  Normal skin texture and turgor.Onychomycosis  Left hallux.  Pincer nail left hallux.  ROV  Discussed this condition with this patient.  We decided to perform nail surgery left hallux.  Treatment options and alternatives discussed.  Recommended permanent phenol matrixectomy and patient agreed.  Left hallux  was prepped with alcohol and a toe block of 3cc of 2% lidocaine plain was administered in a digital toe block. .  The toe was then prepped with betadine solution . A tourniquet was applied to toe. The offending nail border was then excised and matrix tissue exposed.  Phenol was then applied to the matrix tissue followed by an alcohol wash.  Antibiotic ointment and a dry sterile dressing was applied.  The patient was dispensed instructions for  aftercare.  RTC 2 weeks.     Gardiner Barefoot DPM

## 2018-07-19 ENCOUNTER — Encounter: Payer: Self-pay | Admitting: Podiatry

## 2018-07-19 ENCOUNTER — Ambulatory Visit: Payer: 59 | Admitting: Podiatry

## 2018-07-19 DIAGNOSIS — Z09 Encounter for follow-up examination after completed treatment for conditions other than malignant neoplasm: Secondary | ICD-10-CM

## 2018-07-19 NOTE — Progress Notes (Signed)
This patient returns to the office following nail surgery twoi weeks ago.  The patient says toe has been soaked and bandaged as directed.  There has been improvement of the toe since the surgery has been performed. The patient presents for continued evaluation and treatment.  GENERAL APPEARANCE: Alert, conversant. Appropriately groomed. No acute distress.  VASCULAR: Pedal pulses palpable at  [facility] and PT bilateral.  Capillary refill time is immediate to all digits,  Normal temperature gradient.    NEUROLOGIC: sensation is normal to 5.07 monofilament at 5/5 sites bilateral.  Light touch is intact bilateral, Muscle strength normal.  MUSCULOSKELETAL: acceptable muscle strength, tone and stability bilateral.  Intrinsic muscluature intact bilateral.  Rectus appearance of foot and digits noted bilateral.   DERMATOLOGIC: skin color, texture, and turgor are within normal limits.  No preulcerative lesions or ulcers  are seen, no interdigital maceration noted.   NAILS  There is necrotic tissue along the nail groove  In the absence of redness swelling and pain.  DX  S/p nail surgery  ROV  Home instructions were discussed.  Patient to call the office if there are any questions or concerns.   Gardiner Barefoot DPM

## 2018-08-12 ENCOUNTER — Other Ambulatory Visit: Payer: Self-pay | Admitting: Podiatry

## 2018-08-12 ENCOUNTER — Telehealth: Payer: Self-pay | Admitting: Podiatry

## 2018-08-12 MED ORDER — CEPHALEXIN 500 MG PO CAPS: 500.0000 mg | ORAL_CAPSULE | Freq: Three times a day (TID) | ORAL | 0 refills | Status: DC

## 2018-08-12 NOTE — Telephone Encounter (Signed)
I have sent the antibiotic to the pharmacy but she needs to come in to be seen.

## 2018-08-12 NOTE — Telephone Encounter (Signed)
I informed pt of Dr. Leigh Aurora orders. Pt states she will make an appt next week.

## 2018-08-12 NOTE — Telephone Encounter (Signed)
Patient states that she had a ingrown removed last week and needs an antibiotic. Looks like infection with redness/puffness/pus

## 2018-08-12 NOTE — Telephone Encounter (Signed)
Pt called and asked if the medication had been called to the drugstore and I told pt Dr. Jacqualyn Posey was still seeing pt's and would let me know. Pt asked if I would contact her and I told her she should probably call the pharmacy.

## 2018-08-12 NOTE — Telephone Encounter (Signed)
I tried to schedule appt. For pt to come in but she said, that she has too much going on right now, and would just like to have a antibiotic called into [facility] in [place].

## 2018-08-12 NOTE — Telephone Encounter (Signed)
I called pt, asked to describe the toe and what is going on. Pt states the toe is red, swollen, painful with little drainage. I asked pt if she was still performing the soaks and using the antibiotic ointment, but is tomorrow is her parents' 60th wedding anniversary and her ftr died, so she is going to take her mtr and do the balloon thing and would like to get started on the antibiotics and come in if she needed to later. I told pt I would send a message to the doctor on call.

## 2018-09-20 ENCOUNTER — Ambulatory Visit (INDEPENDENT_AMBULATORY_CARE_PROVIDER_SITE_OTHER): Payer: 59 | Admitting: Podiatry

## 2018-09-20 ENCOUNTER — Encounter: Payer: Self-pay | Admitting: Podiatry

## 2018-09-20 DIAGNOSIS — Z09 Encounter for follow-up examination after completed treatment for conditions other than malignant neoplasm: Secondary | ICD-10-CM

## 2018-09-20 NOTE — Progress Notes (Signed)
This patient returns to the office following nail surgery performed October 7 for ingrowing nail inside border left big toe.  Patient says she experiences throbbing pain at night  at the surgical site.  She also has pain wearing shoes.  She is concerned she has an infection at the surgical site.  Patient says there is no redness or drainage at the surgical site.  She returns for evaluation and treatment.  GENERAL APPEARANCE: Alert, conversant. Appropriately groomed. No acute distress.  VASCULAR: Pedal pulses palpable at  [facility] and PT bilateral.  Capillary refill time is immediate to all digits,  Normal temperature gradient.    NEUROLOGIC: sensation is normal to 5.07 monofilament at 5/5 sites bilateral.  Light touch is intact bilateral, Muscle strength normal.  MUSCULOSKELETAL: acceptable muscle strength, tone and stability bilateral.  Intrinsic muscluature intact bilateral.  Rectus appearance of foot and digits noted bilateral.   DERMATOLOGIC: skin color, texture, and turgor are within normal limits.  No preulcerative lesions or ulcers  are seen, no interdigital maceration noted.   NAILS  There is necrotic tissue along the nail groove  In the absence of redness or swelling. . There is hard necrotic crust noted at surgical site  DX  S/p nail surgery  ROV  Hard necrotic crust is removed from the medial border left great toe. RTC prn.   Gardiner Barefoot DPM

## 2018-10-16 ENCOUNTER — Ambulatory Visit
Admission: EM | Admit: 2018-10-16 | Discharge: 2018-10-16 | Disposition: A | Discharge status: Home / Self Care | Payer: 59 | Attending: Family Medicine | Admitting: Family Medicine

## 2018-10-16 ENCOUNTER — Other Ambulatory Visit: Payer: Self-pay

## 2018-10-16 DIAGNOSIS — M5441 Lumbago with sciatica, right side: Secondary | ICD-10-CM

## 2018-10-16 DIAGNOSIS — M545 Low back pain: Secondary | ICD-10-CM | POA: Diagnosis not present

## 2018-10-16 LAB — URINALYSIS, COMPLETE (UACMP) WITH MICROSCOPIC
BACTERIA UA: NONE SEEN
Bilirubin Urine: NEGATIVE
Glucose, UA: NEGATIVE mg/dL
Hgb urine dipstick: NEGATIVE
Ketones, ur: NEGATIVE mg/dL
Leukocytes, UA: NEGATIVE
Nitrite: NEGATIVE
PROTEIN: NEGATIVE mg/dL
Specific Gravity, Urine: 1.02 (ref 1.005–1.030)
WBC UA: NONE SEEN WBC/hpf (ref 0–5)
pH: 5.5 (ref 5.0–8.0)

## 2018-10-16 MED ORDER — CYCLOBENZAPRINE HCL 10 MG PO TABS: 10.0000 mg | ORAL_TABLET | Freq: Two times a day (BID) | ORAL | 0 refills | Status: DC | PRN

## 2018-10-16 MED ORDER — PREDNISONE 10 MG PO TABS: ORAL_TABLET | ORAL | 0 refills | Status: DC

## 2018-10-16 NOTE — ED Provider Notes (Signed)
[facility] ____________________________________________  Time seen: Approximately 10:52 AM  I have reviewed the triage vital signs and the nursing notes.   HISTORY  Chief Complaint Back Pain   HPI Yvette Barr is a 58 y.o. female presenting for evaluation of low back pain.  States pain is been present for approximately 3 to 4 days.  Denies any fall, direct injury or known trigger.  States started with milder pain in her low back, but states now she has intermittently having pain radiating down her right leg.  States occasional tingling sensation in her toes, no complete loss of sensation.  Denies any urinary or bowel retention or incontinence.  Does report she has intermittent chronic constipation with normal bowel movements every 2 to 3 days, and states she has continued with her normal pattern without change.  No dysuria.  States pain is mostly in her right lower back going into her right buttocks.  Denies history of same.  No heavy lifting or strenuous activity.  Denies known trigger.  Denies chest pain, shortness of breath, fevers, abdominal pain or other complaints.  Reports otherwise doing well.  Has not been taking over-the-counter medication for the same complaints.  States pain is worse with coughing, deep breathing, changing positions or bending.  States when she twists her pain is catching.  Denies other aggravating or alleviating factors.  Glean Hess, MD: PCP   Past Medical History:  Diagnosis Date  . Ischemic colitis [facility])     Patient Active Problem List   Diagnosis Date Noted  . S/P hip replacement, right 08/08/2017  . Atherosclerosis of native arteries of extremity with intermittent claudication ([place]) 01/13/2017  . Constipation 07/26/2015  . Tobacco use disorder 07/26/2015  . Insomnia 07/26/2015  . Underweight 07/26/2015  . Ischemic colitis ([place]) 11/07/2014    Past Surgical History:  Procedure Laterality Date  . COLONOSCOPY    . SIGMOIDOSCOPY     . TOTAL HIP ARTHROPLASTY Right 10/26/2017   Procedure: TOTAL HIP ARTHROPLASTY;  Surgeon: Dereck Leep, MD;  Location: [facility] ORS;  Service: Orthopedics;  Laterality: Right;     No current facility-administered medications for this encounter.   Current Outpatient Medications:  .  cyclobenzaprine (FLEXERIL) 10 MG tablet, Take 1 tablet (10 mg total) by mouth 2 (two) times daily as needed for muscle spasms. Do not drive while taking as can cause drowsiness, Disp: 15 tablet, Rfl: 0 .  predniSONE (DELTASONE) 10 MG tablet, Start 60 mg po day one, then 50 mg po day two, taper by 10 mg daily until complete., Disp: 21 tablet, Rfl: 0  Allergies Codeine; Morphine and related; and Morphine  Family History  Problem Relation Age of Onset  . Hyperlipidemia Mother   . Hyperlipidemia Father   . Heart disease Father     Social History Social History   Tobacco Use  . Smoking status: Current Every Day Smoker    Packs/day: 0.10    Types: Cigarettes  . Smokeless tobacco: Never Used  . Tobacco comment: 2 cigerettes a day  Substance Use Topics  . Alcohol use: Yes    Alcohol/week: 2.0 standard drinks    Types: 2 Cans of beer per week    Comment: occasional  . Drug use: No    Review of Systems Constitutional: No fever ENT: No sore throat. Cardiovascular: Denies chest pain. Respiratory: Denies shortness of breath. Gastrointestinal: No abdominal pain.  No nausea, no vomiting.  No diarrhea. LBM 2 days ago, described as "my normal"  Genitourinary: Negative for dysuria. Musculoskeletal: positive for back pain. Skin: Negative for rash. Neurological: Negative for headaches or focal weakness.   ____________________________________________   PHYSICAL EXAM:  VITAL SIGNS: ED Triage Vitals  Enc Vitals Group     BP 10/16/18 0948 (!) 138/91     Pulse Rate 10/16/18 0948 73     Resp 10/16/18 0948 18     Temp 10/16/18 0948 98.2 F (36.8 C)     Temp Source 10/16/18 0948 Oral     SpO2 10/16/18  0948 98 %     Weight 10/16/18 0946 145 lb (65.8 kg)     Height 10/16/18 0946 5\' 6"  (1.676 m)     Head Circumference --      Peak Flow --      Pain Score 10/16/18 0946 8     Pain Loc --      Pain Edu? --      Excl. in [place]? --     Constitutional: Alert and oriented. Well appearing and in no acute distress. ENT      Head: Normocephalic and atraumatic. Cardiovascular: Normal rate, regular rhythm. Grossly normal heart sounds.  Good peripheral circulation. Respiratory: Normal respiratory effort without tachypnea nor retractions. Breath sounds are clear and equal bilaterally. No wheezes, rales, rhonchi. Gastrointestinal: Soft and nontender. No distention.No CVA tenderness. Musculoskeletal:  No midline cervical or thoracic tenderness to palpation. Bilateral pedal pulses equal and easily palpated. Except: Mild diffuse lower lumbar tenderness palpation bilaterally.  Moderate right lower paralumbar tenderness palpation along right sciatic notch and into right piriformis, mild right leg pain with standing knee left, no pain with left knee left, pain with lumbar flexion and extension as well as right and left rotation, left worse than right but full range of motion present, no saddle anesthesia, steady gait, changes positions quickly in room, bilateral plantar flexion dorsiflexion strong and equal. Neurologic:  Normal speech and language. No gross focal neurologic deficits are appreciated. Speech is normal. No gait instability.  Skin:  Skin is warm, dry and intact. No rash noted. Psychiatric: Mood and affect are normal. Speech and behavior are normal. Patient exhibits appropriate insight and judgment   ___________________________________________   LABS (all labs ordered are listed, but only abnormal results are displayed)  Labs Reviewed  URINALYSIS, COMPLETE (UACMP) WITH MICROSCOPIC     PROCEDURES Procedures    INITIAL IMPRESSION / [place] / ED COURSE  Pertinent labs & imaging  results that were available during my care of the patient were reviewed by me and considered in my medical decision making (see chart for details).  Well-appearing patient.  No acute distress.  Urinalysis unremarkable.  Suspect right lower sciatica.  Encouraged rest, stretching, heat, ice, will treat with prednisone and PRN muscle relaxant.  Discussed her follow-up and return parameters.  As no trauma will defer x-rays at this time, patient agrees this plan. Discussed indication, risks and benefits of medications with patient.  Discussed follow up with Primary care physician this week. Discussed follow up and return parameters including no resolution or any worsening concerns. Patient verbalized understanding and agreed to plan.   ____________________________________________   FINAL CLINICAL IMPRESSION(S) / ED DIAGNOSES  Final diagnoses:  Acute bilateral low back pain with right-sided sciatica     ED Discharge Orders         Ordered    predniSONE (DELTASONE) 10 MG tablet     10/16/18 1040    cyclobenzaprine (FLEXERIL) 10 MG tablet  2 times daily  PRN     10/16/18 1040           Note: This dictation was prepared with Dragon dictation along with smaller phrase technology. Any transcriptional errors that result from this process are unintentional.         Marylene Land, NP 10/16/18 1147

## 2018-10-16 NOTE — Discharge Instructions (Addendum)
Take medication as prescribed. Rest. Drink plenty of fluids. Stretch. Ice. Heat.  ° °Follow up with your primary care physician this week as needed. Return to Urgent care for new or worsening concerns.  ° °

## 2018-10-16 NOTE — ED Triage Notes (Signed)
Patient complains of low back pain that started a couple days ago. Patient states that she wasn't sure if she had a kidney infection or if she pulled something. Patient states that when she coughs the pain is worse.

## 2018-11-09 ENCOUNTER — Emergency Department
Admission: EM | Admit: 2018-11-09 | Discharge: 2018-11-09 | Disposition: A | Discharge status: Home / Self Care | Payer: 59 | Attending: Emergency Medicine | Admitting: Emergency Medicine

## 2018-11-09 ENCOUNTER — Emergency Department: Payer: 59

## 2018-11-09 ENCOUNTER — Other Ambulatory Visit: Payer: Self-pay

## 2018-11-09 ENCOUNTER — Encounter: Payer: Self-pay | Admitting: Emergency Medicine

## 2018-11-09 DIAGNOSIS — M5431 Sciatica, right side: Secondary | ICD-10-CM | POA: Insufficient documentation

## 2018-11-09 DIAGNOSIS — F17210 Nicotine dependence, cigarettes, uncomplicated: Secondary | ICD-10-CM | POA: Diagnosis not present

## 2018-11-09 DIAGNOSIS — M545 Low back pain: Secondary | ICD-10-CM | POA: Diagnosis present

## 2018-11-09 DIAGNOSIS — M5126 Other intervertebral disc displacement, lumbar region: Secondary | ICD-10-CM | POA: Insufficient documentation

## 2018-11-09 DIAGNOSIS — M5441 Lumbago with sciatica, right side: Secondary | ICD-10-CM

## 2018-11-09 DIAGNOSIS — M5136 Other intervertebral disc degeneration, lumbar region: Secondary | ICD-10-CM

## 2018-11-09 DIAGNOSIS — Z96641 Presence of right artificial hip joint: Secondary | ICD-10-CM | POA: Diagnosis not present

## 2018-11-09 DIAGNOSIS — Z79899 Other long term (current) drug therapy: Secondary | ICD-10-CM | POA: Insufficient documentation

## 2018-11-09 MED ORDER — HYDROCODONE-ACETAMINOPHEN 5-325 MG PO TABS: 1.0000 | ORAL_TABLET | Freq: Four times a day (QID) | ORAL | 0 refills | Status: DC | PRN

## 2018-11-09 MED ORDER — MELOXICAM 15 MG PO TABS: 15.0000 mg | ORAL_TABLET | Freq: Every day | ORAL | 2 refills | Status: DC

## 2018-11-09 MED ORDER — FENTANYL CITRATE (PF) 100 MCG/2ML IJ SOLN
50.0000 ug | Freq: Once | INTRAMUSCULAR | Status: AC
  Administered 2018-11-09: 50 ug via INTRAVENOUS
  Filled 2018-11-09: qty 2

## 2018-11-09 MED ORDER — FENTANYL CITRATE (PF) 100 MCG/2ML IJ SOLN
50.0000 ug | Freq: Once | INTRAMUSCULAR | Status: AC
Start: 2018-11-09 — End: 2018-11-09
  Administered 2018-11-09: 50 ug via INTRAVENOUS
  Filled 2018-11-09: qty 2

## 2018-11-09 MED ORDER — BACLOFEN 10 MG PO TABS: 10.0000 mg | ORAL_TABLET | Freq: Three times a day (TID) | ORAL | 1 refills | Status: DC

## 2018-11-09 NOTE — ED Notes (Signed)
See triage note  States she developed pain to lower back  Pain is moving into right leg  Was seen at Urgent Care and placed on prednisone about 2 weeks ago  States this am pain increased  Denies any new injury  Hx of hip surgery to same side

## 2018-11-09 NOTE — ED Notes (Signed)
Taken to MRI by staff

## 2018-11-09 NOTE — Discharge Instructions (Addendum)
Follow-up with Dr. Lacinda Axon.  Please call for an appointment.  Use medications as prescribed.  Apply ice to your lower back.

## 2018-11-09 NOTE — ED Provider Notes (Signed)
[facility] Emergency Department Provider Note  ____________________________________________   First MD Initiated Contact with Patient 11/09/18 [phone]     (approximate)  I have reviewed the triage vital signs and the nursing notes.   HISTORY  Chief Complaint Back Pain    HPI ANJULI GEMMILL is a 58 y.o. female presents emergency department complaining of low back pain.  States pain radiates into the right leg.  She was seen in urgent care and placed on prednisone about 2 weeks ago but the pain has since increase.  She denies any injuries.  She states that she does have osteoporosis.  She states that she has a history of a hip replacement on the right side as the bones shattered.  She denies any fever or chills.    Past Medical History:  Diagnosis Date  . Ischemic colitis [facility])     Patient Active Problem List   Diagnosis Date Noted  . S/P hip replacement, right 08/08/2017  . Atherosclerosis of native arteries of extremity with intermittent claudication ([place]) 01/13/2017  . Constipation 07/26/2015  . Tobacco use disorder 07/26/2015  . Insomnia 07/26/2015  . Underweight 07/26/2015  . Ischemic colitis ([place]) 11/07/2014    Past Surgical History:  Procedure Laterality Date  . COLONOSCOPY    . SIGMOIDOSCOPY    . TOTAL HIP ARTHROPLASTY Right 10/26/2017   Procedure: TOTAL HIP ARTHROPLASTY;  Surgeon: Dereck Leep, MD;  Location: [facility] ORS;  Service: Orthopedics;  Laterality: Right;    Prior to Admission medications   Medication Sig Start Date End Date Taking? Authorizing Provider  baclofen (LIORESAL) 10 MG tablet Take 1 tablet (10 mg total) by mouth 3 (three) times daily. 11/09/18 11/09/19  Legion Discher, Linden Dolin, PA-C  cyclobenzaprine (FLEXERIL) 10 MG tablet Take 1 tablet (10 mg total) by mouth 2 (two) times daily as needed for muscle spasms. Do not drive while taking as can cause drowsiness 10/16/18   Marylene Land, NP  HYDROcodone-acetaminophen (NORCO/VICODIN)  5-325 MG tablet Take 1 tablet by mouth every 6 (six) hours as needed for moderate pain. 11/09/18   Avalynne Diver, Linden Dolin, PA-C  meloxicam (MOBIC) 15 MG tablet Take 1 tablet (15 mg total) by mouth daily. 11/09/18 11/09/19  Haider Hornaday, Linden Dolin, PA-C  predniSONE (DELTASONE) 10 MG tablet Start 60 mg po day one, then 50 mg po day two, taper by 10 mg daily until complete. 10/16/18   Marylene Land, NP    Allergies Codeine; Morphine and related; and Morphine  Family History  Problem Relation Age of Onset  . Hyperlipidemia Mother   . Hyperlipidemia Father   . Heart disease Father     Social History Social History   Tobacco Use  . Smoking status: Current Every Day Smoker    Packs/day: 0.10    Types: Cigarettes  . Smokeless tobacco: Never Used  . Tobacco comment: 2 cigerettes a day  Substance Use Topics  . Alcohol use: Yes    Alcohol/week: 2.0 standard drinks    Types: 2 Cans of beer per week    Comment: occasional  . Drug use: No    Review of Systems  Constitutional: No fever/chills Eyes: No visual changes. ENT: No sore throat. Respiratory: Denies cough Genitourinary: Negative for dysuria. Musculoskeletal: Positive for back pain. Skin: Negative for rash.    ____________________________________________   PHYSICAL EXAM:  VITAL SIGNS: ED Triage Vitals  Enc Vitals Group     BP 11/09/18 0849 (!) 126/98     Pulse Rate 11/09/18 0849  79     Resp 11/09/18 0849 18     Temp 11/09/18 0849 98.3 F (36.8 C)     Temp Source 11/09/18 0849 Oral     SpO2 11/09/18 0849 97 %     Weight 11/09/18 0845 140 lb (63.5 kg)     Height 11/09/18 0845 5\' 6"  (1.676 m)     Head Circumference --      Peak Flow --      Pain Score 11/09/18 0844 10     Pain Loc --      Pain Edu? --      Excl. in [place]? --     Constitutional: Alert and oriented. Well appearing and in no acute distress.  Peers to feel uncomfortable Eyes: Conjunctivae are normal.  Head: Atraumatic. Nose: No  congestion/rhinnorhea. Mouth/Throat: Mucous membranes are moist.   Neck:  supple no lymphadenopathy noted Cardiovascular: Normal rate, regular rhythm. Heart sounds are normal Respiratory: Normal respiratory effort.  No retractions, lungs c t a  Abd: soft nontender bs normal all 4 quad GU: deferred Musculoskeletal: Patient moves slowly and has difficulty going from the wheelchair to the bed.  The lumbar spine is tender along with the SI joints.  The hip is not tender.  The right great toe has decreased strength when compared with the left  neurologic:  Normal speech and language.  Skin:  Skin is warm, dry and intact. No rash noted. Psychiatric: Mood and affect are normal. Speech and behavior are normal.  ____________________________________________   LABS (all labs ordered are listed, but only abnormal results are displayed)  Labs Reviewed - No data to display ____________________________________________   ____________________________________________  RADIOLOGY  CT of the lumbar spine shows bulging disc MRI shows bulging disks no impingement on the nerves or in intrusion into the spinal canal  ____________________________________________   PROCEDURES  Procedure(s) performed: Saline lock, fentanyl IV  Procedures    ____________________________________________   INITIAL IMPRESSION / ASSESSMENT AND PLAN / ED COURSE  Pertinent labs & imaging results that were available during my care of the patient were reviewed by me and considered in my medical decision making (see chart for details).   Patient is 58 year old female presents emergency department complaining of lower back pain for over 2 weeks.  She was seen at urgent care and put on a 12-day Dosepak of steroids.  She states she did not improve during this and feels like she is gotten worse.  She has a history of a right hip replacement.  She states she thinks she has osteoporosis but is not sure.  Physical exam shows  patient is very uncomfortable.  She has difficulty standing from the wheelchair to go to the stretcher.  She has tenderness along the lumbar spine.  Decreased strength in the right great toe when compared to the left.  CT of the lumbar spine shows bulging disc.  Discussed this with Dr. Lacinda Axon neurologist.  He encouraged me to get a MRI while patient is here.   Page Dr. Lacinda Axon after the MRI.  He agrees he does not feel this is surgical and will have the patient follow-up in his office.  Patient has become more comfortable with her fentanyl.  She was given a prescription for meloxicam, baclofen, and Vicodin.  She is to follow-up with Dr. Lacinda Axon.  She was given a work note.  She states she understands.  She is to return if any cauda equina symptoms are present.  She was discharged in stable condition.  As part of my medical decision making, I reviewed the following data within the [place] History obtained from family, Nursing notes reviewed and incorporated, Old chart reviewed, Radiograph reviewed CT and MRI showed bulging disc, A consult was requested and obtained from this/these consultant(s) Neurosurgery, Notes from prior ED visits and [place] Controlled Substance Database  ____________________________________________   FINAL CLINICAL IMPRESSION(S) / ED DIAGNOSES  Final diagnoses:  Acute midline low back pain with right-sided sciatica  Bulging lumbar disc      NEW MEDICATIONS STARTED DURING THIS VISIT:  Discharge Medication List as of 11/09/2018  2:41 PM    START taking these medications   Details  baclofen (LIORESAL) 10 MG tablet Take 1 tablet (10 mg total) by mouth 3 (three) times daily., Starting Tue 11/09/2018, Until Wed 11/09/2019, Normal    HYDROcodone-acetaminophen (NORCO/VICODIN) 5-325 MG tablet Take 1 tablet by mouth every 6 (six) hours as needed for moderate pain., Starting Tue 11/09/2018, Normal    meloxicam (MOBIC) 15 MG tablet Take 1 tablet (15 mg total) by mouth  daily., Starting Tue 11/09/2018, Until Wed 11/09/2019, Normal         Note:  This document was prepared using Dragon voice recognition software and may include unintentional dictation errors.    Versie Starks, PA-C 11/09/18 1511    Eula Listen, MD 11/10/18 Joen Laura

## 2018-11-09 NOTE — ED Triage Notes (Signed)
Says has back pain all they down.  Started yesterday.  Was put on prednisone for it

## 2018-11-11 ENCOUNTER — Emergency Department
Admission: EM | Admit: 2018-11-11 | Discharge: 2018-11-11 | Disposition: A | Discharge status: Home / Self Care | Payer: 59 | Attending: Emergency Medicine | Admitting: Emergency Medicine

## 2018-11-11 ENCOUNTER — Encounter: Payer: Self-pay | Admitting: Emergency Medicine

## 2018-11-11 ENCOUNTER — Other Ambulatory Visit: Payer: Self-pay

## 2018-11-11 DIAGNOSIS — N390 Urinary tract infection, site not specified: Secondary | ICD-10-CM | POA: Diagnosis not present

## 2018-11-11 DIAGNOSIS — F17210 Nicotine dependence, cigarettes, uncomplicated: Secondary | ICD-10-CM | POA: Diagnosis not present

## 2018-11-11 DIAGNOSIS — M5441 Lumbago with sciatica, right side: Secondary | ICD-10-CM | POA: Diagnosis not present

## 2018-11-11 DIAGNOSIS — M549 Dorsalgia, unspecified: Secondary | ICD-10-CM | POA: Diagnosis present

## 2018-11-11 LAB — URINALYSIS, COMPLETE (UACMP) WITH MICROSCOPIC
Bilirubin Urine: NEGATIVE
GLUCOSE, UA: NEGATIVE mg/dL
Hgb urine dipstick: NEGATIVE
KETONES UR: 5 mg/dL — AB
Nitrite: NEGATIVE
PROTEIN: NEGATIVE mg/dL
Specific Gravity, Urine: 1.021 (ref 1.005–1.030)
pH: 7 (ref 5.0–8.0)

## 2018-11-11 MED ORDER — CEFTRIAXONE SODIUM 1 G IJ SOLR
1.0000 g | Freq: Once | INTRAMUSCULAR | Status: AC
  Administered 2018-11-11: 1 g via INTRAMUSCULAR
  Filled 2018-11-11: qty 10

## 2018-11-11 MED ORDER — SULFAMETHOXAZOLE-TRIMETHOPRIM 800-160 MG PO TABS: 1.0000 | ORAL_TABLET | Freq: Two times a day (BID) | ORAL | 0 refills | Status: DC

## 2018-11-11 MED ORDER — LIDOCAINE HCL (PF) 1 % IJ SOLN
5.0000 mL | Freq: Once | INTRAMUSCULAR | Status: AC
Start: 2018-11-11 — End: 2018-11-11
  Administered 2018-11-11: 5 mL
  Filled 2018-11-11: qty 5

## 2018-11-11 MED ORDER — OXYCODONE-ACETAMINOPHEN 7.5-325 MG PO TABS: 1.0000 | ORAL_TABLET | Freq: Three times a day (TID) | ORAL | 0 refills | Status: DC | PRN

## 2018-11-11 NOTE — ED Provider Notes (Signed)
[facility] Emergency Department Provider Note  ___________________________________________   First MD Initiated Contact with Patient 11/11/18 1231     (approximate)  I have reviewed the triage vital signs and the nursing notes.   HISTORY  Chief Complaint Back Pain   HPI Yvette Barr is a 58 y.o. female presents to the ED with complaint of back pain unrelieved by her present medication.  Patient was seen in the ED  on 11/09/2018 at which time she was given a prescription for baclofen, meloxicam and Norco.  Patient states that this medication is not helping.  She continues to have sciatica to the right lower extremity.  Patient currently has an appointment with Dr. Lacinda Axon on Tuesday the 18th.  Patient also complains of urinary frequency with some dysuria.  Currently she rates her pain as a 10/10.   Past Medical History:  Diagnosis Date  . Ischemic colitis [facility])     Patient Active Problem List   Diagnosis Date Noted  . S/P hip replacement, right 08/08/2017  . Atherosclerosis of native arteries of extremity with intermittent claudication ([place]) 01/13/2017  . Constipation 07/26/2015  . Tobacco use disorder 07/26/2015  . Insomnia 07/26/2015  . Underweight 07/26/2015  . Ischemic colitis ([place]) 11/07/2014    Past Surgical History:  Procedure Laterality Date  . COLONOSCOPY    . SIGMOIDOSCOPY    . TOTAL HIP ARTHROPLASTY Right 10/26/2017   Procedure: TOTAL HIP ARTHROPLASTY;  Surgeon: Dereck Leep, MD;  Location: [facility] ORS;  Service: Orthopedics;  Laterality: Right;    Prior to Admission medications   Medication Sig Start Date End Date Taking? Authorizing Provider  baclofen (LIORESAL) 10 MG tablet Take 1 tablet (10 mg total) by mouth 3 (three) times daily. 11/09/18 11/09/19  Fisher, Linden Dolin, PA-C  meloxicam (MOBIC) 15 MG tablet Take 1 tablet (15 mg total) by mouth daily. 11/09/18 11/09/19  Fisher, Linden Dolin, PA-C  oxyCODONE-acetaminophen (PERCOCET) 7.5-325 MG  tablet Take 1 tablet by mouth every 8 (eight) hours as needed for severe pain. 11/11/18 11/11/19  Johnn Hai, PA-C  sulfamethoxazole-trimethoprim (BACTRIM DS,SEPTRA DS) 800-160 MG tablet Take 1 tablet by mouth 2 (two) times daily. 11/11/18   Johnn Hai, PA-C    Allergies Codeine; Morphine and related; and Morphine  Family History  Problem Relation Age of Onset  . Hyperlipidemia Mother   . Hyperlipidemia Father   . Heart disease Father     Social History Social History   Tobacco Use  . Smoking status: Current Every Day Smoker    Packs/day: 0.10    Types: Cigarettes  . Smokeless tobacco: Never Used  . Tobacco comment: 2 cigerettes a day  Substance Use Topics  . Alcohol use: Yes    Alcohol/week: 2.0 standard drinks    Types: 2 Cans of beer per week    Comment: occasional  . Drug use: No    Review of Systems Constitutional: No fever/chills Cardiovascular: Denies chest pain. Respiratory: Denies shortness of breath. Gastrointestinal: No abdominal pain.  No nausea, no vomiting.  No diarrhea.   Genitourinary: Positive for dysuria and frequency. Musculoskeletal: Positive for back pain with right leg sciatica. Skin: Negative for rash. Neurological: Negative for headaches, focal weakness or numbness. ____________________________________________   PHYSICAL EXAM:  VITAL SIGNS: ED Triage Vitals  Enc Vitals Group     BP 11/11/18 1151 106/80     Pulse Rate 11/11/18 1151 (!) 104     Resp 11/11/18 1151 18  Temp 11/11/18 1151 98.1 F (36.7 C)     Temp Source 11/11/18 1151 Oral     SpO2 11/11/18 1151 97 %     Weight 11/11/18 1154 140 lb (63.5 kg)     Height 11/11/18 1154 5\' 6"  (1.676 m)     Head Circumference --      Peak Flow --      Pain Score 11/11/18 1153 10     Pain Loc --      Pain Edu? --      Excl. in [place]? --     Constitutional: Alert and oriented. Well appearing and in no acute distress. Eyes: Conjunctivae are normal.  Head: Atraumatic. Nose: No  congestion/rhinnorhea. Mouth/Throat: Mucous membranes are moist.  Oropharynx non-erythematous. Neck: No stridor.   Cardiovascular: Normal rate, regular rhythm. Grossly normal heart sounds.  Good peripheral circulation. Respiratory: Normal respiratory effort.  No retractions. Lungs CTAB. Gastrointestinal: Soft and nontender. No distention.  Musculoskeletal: There is no gross deformity when examining the back however there is diffuse tenderness on palpation of the L5-S1 area and right SI joint and surrounding tissue.  Range of motion is guarded and slow secondary to discomfort.  No skin discoloration or evidence of injury.  Patient has slow guarded gait while going to the restroom to get a urine specimen but was ambulatory without any assistance. Neurologic:  Normal speech and language. No gross focal neurologic deficits are appreciated.  Reflexes are 1+ bilaterally.   Skin:  Skin is warm, dry and intact. No rash noted. Psychiatric: Mood and affect are normal. Speech and behavior are normal.  ____________________________________________   LABS (all labs ordered are listed, but only abnormal results are displayed)  Labs Reviewed  URINALYSIS, COMPLETE (UACMP) WITH MICROSCOPIC - Abnormal; Notable for the following components:      Result Value   Color, Urine YELLOW (*)    APPearance CLEAR (*)    Ketones, ur 5 (*)    Leukocytes,Ua SMALL (*)    WBC, UA >50 (*)    Bacteria, UA RARE (*)    All other components within normal limits  URINE CULTURE    PROCEDURES  Procedure(s) performed: None  Procedures  Critical Care performed: No  ____________________________________________   INITIAL IMPRESSION / ASSESSMENT AND PLAN / ED COURSE  As part of my medical decision making, I reviewed the following data within the electronic MEDICAL RECORD NUMBER Notes from prior ED visits and [place] Controlled Substance Database  Patient presents to the ED with complaint of low back pain with radiation into her  right extremity.  Imaging including MRI of her LS spine from her last visit was reviewed.  Patient is currently taking baclofen, meloxicam and Norco stating that this is not giving her any pain relief.  She also complained of urinary frequency.  Urinalysis revealed a urinary tract infection for which she was given Rocephin 1 g IM.  Patient is to continue taking the baclofen and meloxicam.  She is to discontinue taking the Norco and will start on oxycodone 7.51 every 8 hours.  She was also given a prescription for Bactrim DS twice daily for 10 days.  Patient has an appointment with Dr. Lacinda Axon on Tuesday the 18th.  Patient was given a note to remain out of work until she is seen by Dr. Lacinda Axon.  She was made aware that the emergency department does not right or fill out FLMA papers and would need to be filled out by her PCP or Dr. Lacinda Axon.  ____________________________________________   FINAL CLINICAL IMPRESSION(S) / ED DIAGNOSES  Final diagnoses:  Acute urinary tract infection  Acute right-sided low back pain with right-sided sciatica     ED Discharge Orders         Ordered    oxyCODONE-acetaminophen (PERCOCET) 7.5-325 MG tablet  Every 8 hours PRN     11/11/18 1451    sulfamethoxazole-trimethoprim (BACTRIM DS,SEPTRA DS) 800-160 MG tablet  2 times daily     11/11/18 1451           Note:  This document was prepared using Dragon voice recognition software and may include unintentional dictation errors.    Johnn Hai, PA-C 11/11/18 [place], [place], MD 11/14/18 [phone]

## 2018-11-11 NOTE — ED Notes (Signed)
This RN notified lab technician Levada Dy to place an add-on a urine culture for pt.

## 2018-11-11 NOTE — Discharge Instructions (Addendum)
Keep your appointment with Dr. Lacinda Axon on 11/16/2018 Begin taking antibiotics until completely finished.  Continue taking baclofen 3 times a day as needed for muscle spasms, discontinue taking hydrocodone/acetaminophen.  Continue taking meloxicam 15 mg daily with food. Begin taking oxycodone only as directed.  Discontinue taking this if you have any difficulties with this medication.  Also a prescription for your urinary tract infection was sent to the pharmacy.  You need to take all of this until completely finished.  Also increase fluids including cranberry juice if you are able to drink it.

## 2018-11-11 NOTE — ED Notes (Signed)
Pt asking to take home medications. Nurse explained if she was going home on a stronger medication she may want to wait.

## 2018-11-11 NOTE — ED Triage Notes (Signed)
Pt arrives with complaints of back pain that is unrelieved with prescribed medication. Pt was seen here and was referred to a surgeon but cannot see surgeon until Tuesday. Pt states she is here for pain relief.

## 2018-11-13 LAB — URINE CULTURE
Culture: >=100000 — AB
SPECIAL REQUESTS: NORMAL

## 2018-11-19 IMAGING — US US EXTREM LOW VENOUS*R*
1 series · 13 of 24 positions shown · non-contrast
Comparison: None.

CLINICAL DATA: RIGHT lower extremity pain for 3 days.



[Series 1: us extrem low venous*right* · 0.08mm/px · 13 of 39 slices shown]
[im 1/39]
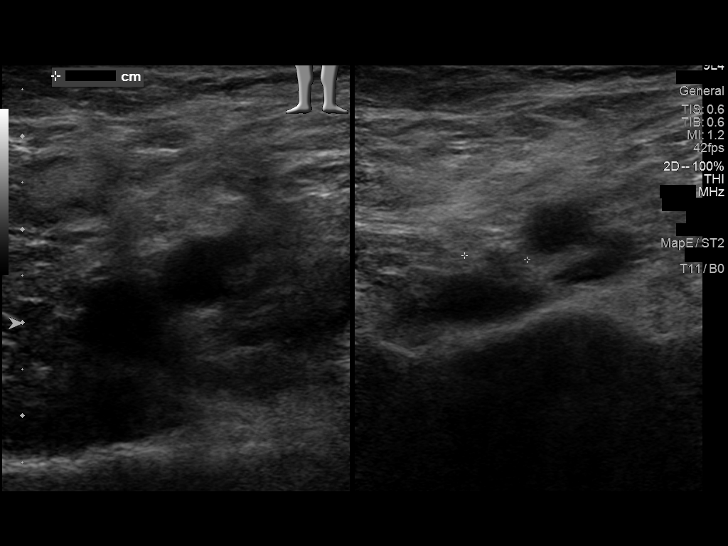
[im 4/39]
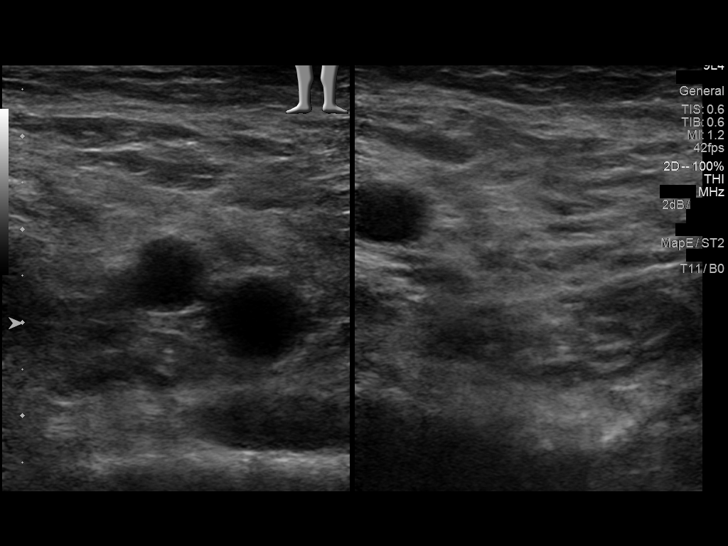
[im 7/39]
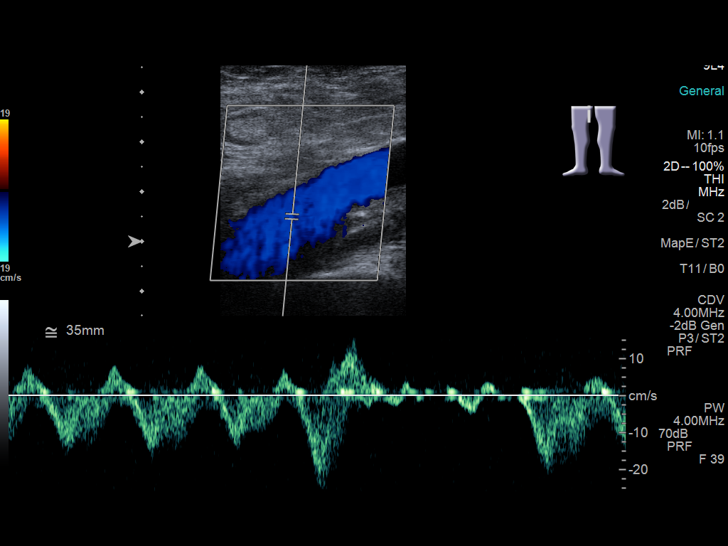
[im 10/39]
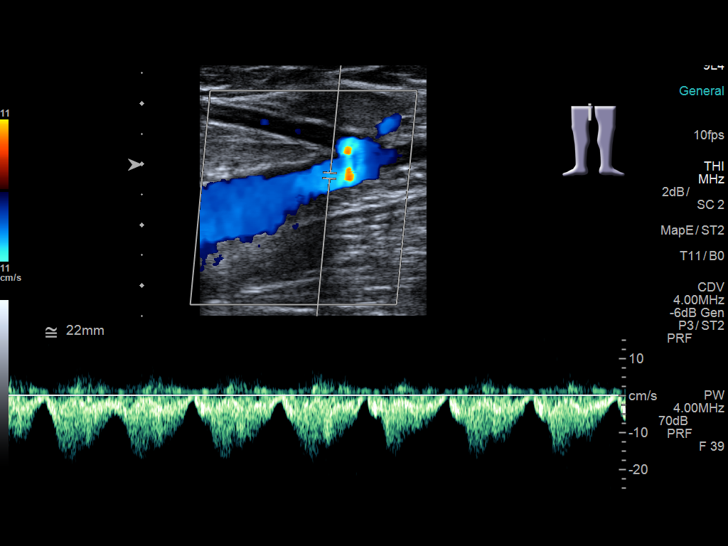
[im 14/39]
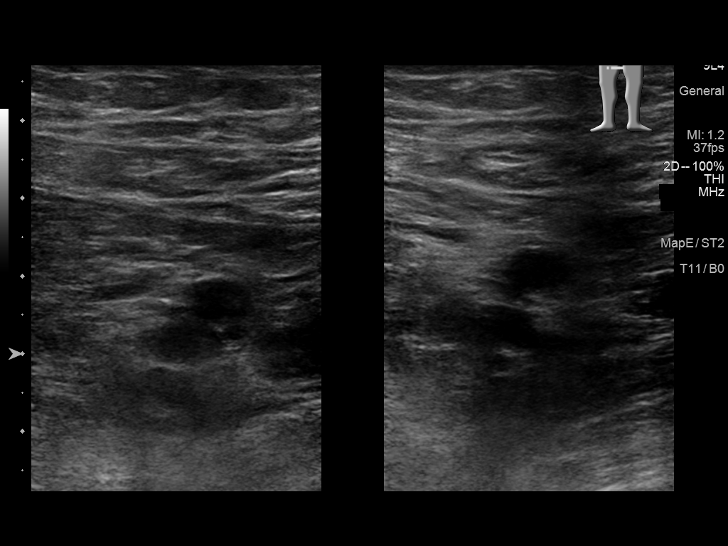
[im 17/39]
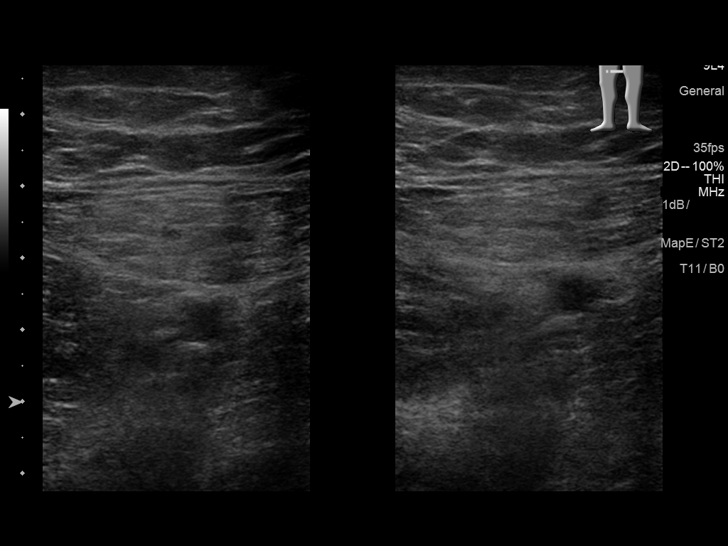
[im 20/39]
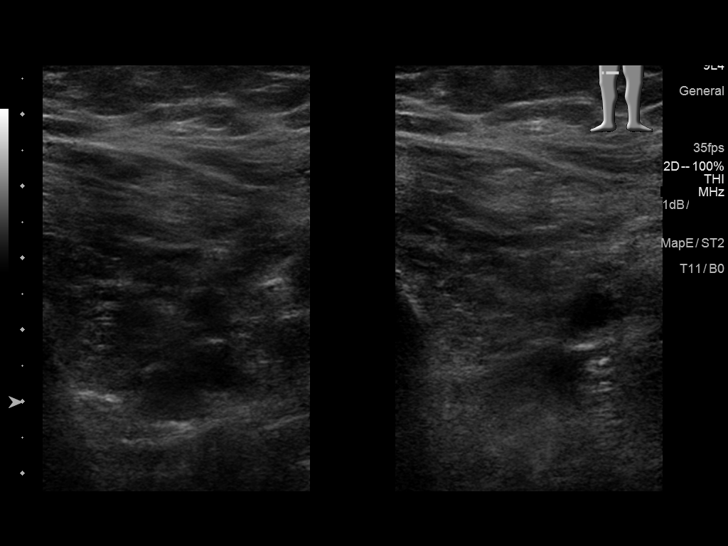
[im 22/39]
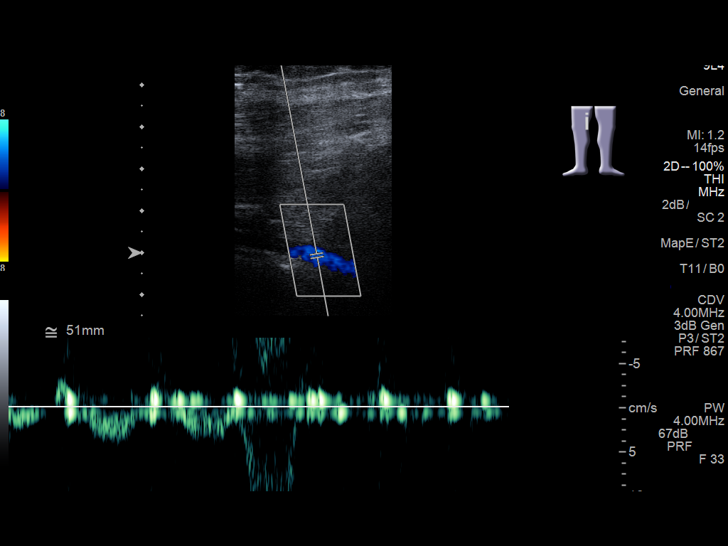
[im 25/39]
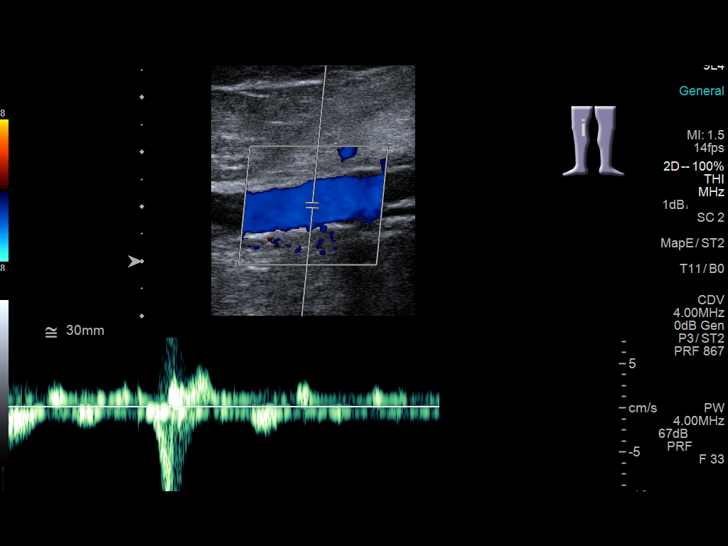
[im 29/39]
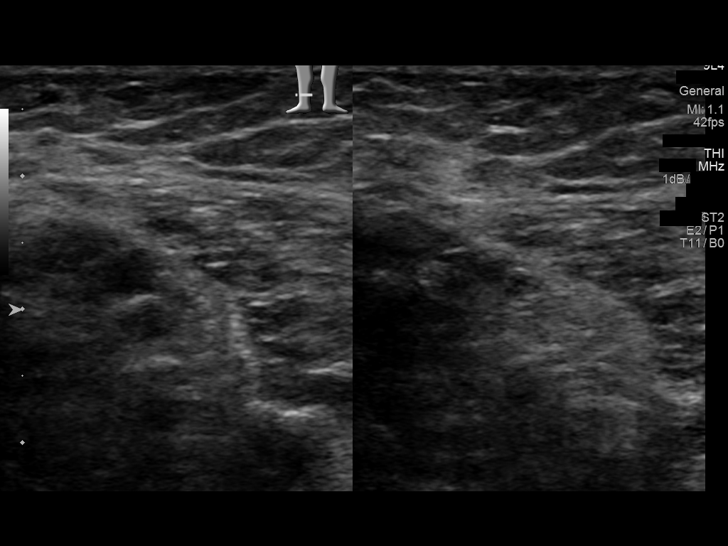
[im 32/39]
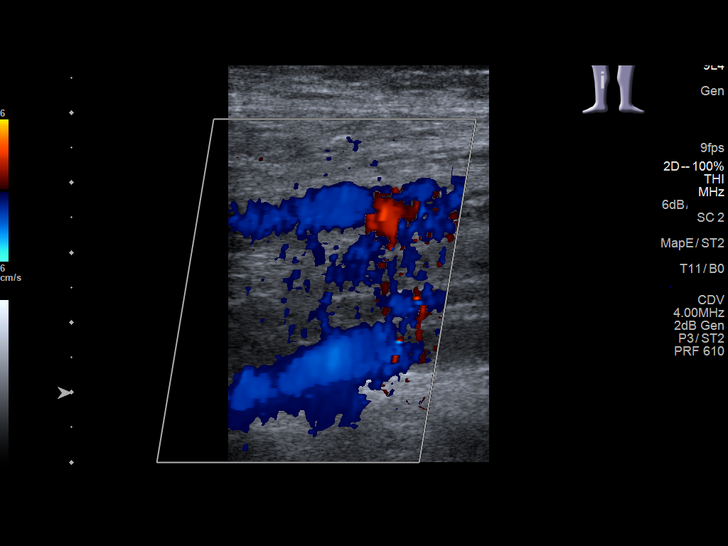
[im 35/39]
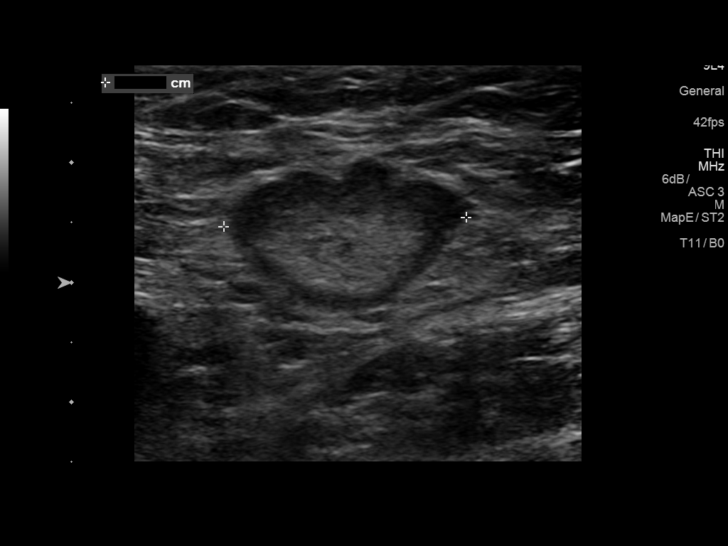
[im 39/39]
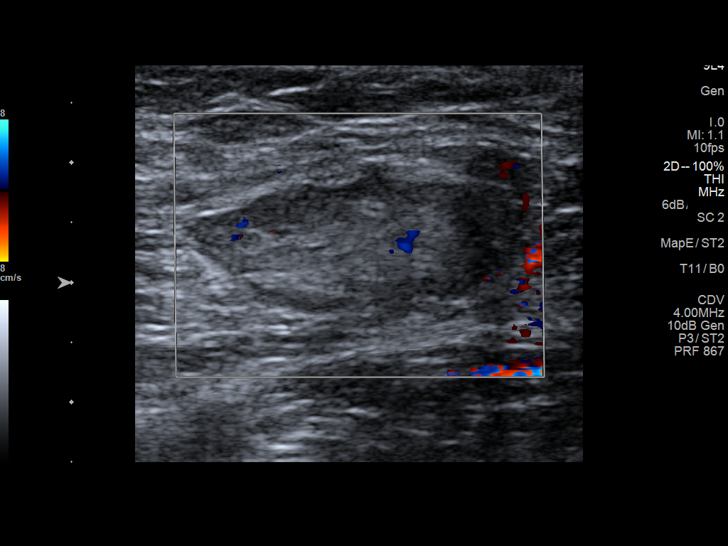

[13 of 24 positions shown; findings below may reference images not displayed]

FINDINGS: Contralateral Common Femoral Vein: Respiratory phasicity is normal
and symmetric with the symptomatic side. No evidence of thrombus.
Normal compressibility.

Common Femoral Vein: No evidence of thrombus. Normal
compressibility, respiratory phasicity and response to augmentation.

Profunda Femoral Vein: No evidence of thrombus. Normal
compressibility and flow on color Doppler imaging.

Femoral Vein: No evidence of thrombus. Normal compressibility,
respiratory phasicity and response to augmentation.

Popliteal Vein: No evidence of thrombus. Normal compressibility,
respiratory phasicity and response to augmentation.

Calf Veins: No evidence of thrombus. Normal compressibility and flow
on color Doppler imaging.

Superficial Great Saphenous Vein: No evidence of thrombus. Normal
compressibility and flow on color Doppler imaging.

Venous Reflux:  None.

Other Findings: Pulsatile spectral waveforms associated with RIGHT
heart failure.
IMPRESSION: No evidence of RIGHT lower extremity deep venous thrombosis.

Pulsatile spectral waveforms seen with RIGHT heart failure,
recommend chest radiograph.

## 2019-11-01 ENCOUNTER — Ambulatory Visit: Payer: 59 | Admitting: Internal Medicine

## 2019-11-01 ENCOUNTER — Encounter: Payer: Self-pay | Admitting: Internal Medicine

## 2019-11-01 ENCOUNTER — Other Ambulatory Visit: Payer: Self-pay

## 2019-11-01 VITALS — BP 112/78 | HR 86 | Temp 98.2°F | Ht 66.0 in | Wt 164.0 lb

## 2019-11-01 DIAGNOSIS — F321 Major depressive disorder, single episode, moderate: Secondary | ICD-10-CM

## 2019-11-01 DIAGNOSIS — M79671 Pain in right foot: Secondary | ICD-10-CM | POA: Diagnosis not present

## 2019-11-01 DIAGNOSIS — R252 Cramp and spasm: Secondary | ICD-10-CM | POA: Insufficient documentation

## 2019-11-01 HISTORY — DX: Major depressive disorder, single episode, moderate: F32.1

## 2019-11-01 MED ORDER — ESCITALOPRAM OXALATE 10 MG PO TABS: 10.0000 mg | ORAL_TABLET | Freq: Every day | ORAL | 1 refills | Status: DC

## 2019-11-01 NOTE — Progress Notes (Signed)
Date:  11/01/2019   Name:  Yvette Barr   DOB:  [birth year]   MRN:  [id number]   Chief Complaint: Leg Pain (Rt leg cramps inside of thigh. Ortho doctor said it could be potassium but she also has issues with her right toes ache and cramp. Started last Friday.  ) and Anxiety (Asking to have a Rx for clonazepam. Her mother has taking this. Struggling with depression and anxiety due to the loss of her sister, father, and husband in the last 6 years. )  Depression        This is a new problem.  The onset quality is undetermined.   The problem has been gradually worsening since onset.  Associated symptoms include hopelessness, insomnia, decreased interest and myalgias.  Associated symptoms include no fatigue, no body aches, no headaches and no suicidal ideas.  Past treatments include nothing (mother is taking clonazepam and she would like to try that). Muscle Pain This is a recurrent problem. The current episode started more than 1 month ago. The problem occurs every several days. Pertinent negatives include no abdominal pain, chest pain, constipation, diarrhea, fatigue, fever, headaches, shortness of breath or wheezing.  Foot Injury  There was no injury mechanism. The pain is present in the right foot and right toes. The quality of the pain is described as burning and cramping. The pain is moderate. The pain has been fluctuating since onset. Associated symptoms include tingling. She reports no foreign bodies present. The symptoms are aggravated by weight bearing and palpation. She has tried nothing for the symptoms.    Lab Results  Component Value Date   CREATININE 0.99 10/14/2017   BUN 31 (H) 10/14/2017   NA 140 10/14/2017   K 3.6 10/14/2017   CL 106 10/14/2017   CO2 25 10/14/2017   Lab Results  Component Value Date   CHOL 139 01/25/2016   HDL 61 01/25/2016   LDLCALC 69 01/25/2016   TRIG 44 01/25/2016   CHOLHDL 2.3 01/25/2016   Lab Results  Component Value Date   TSH 1.740 01/25/2016     No results found for: HGBA1C   Review of Systems  Constitutional: Negative for chills, fatigue and fever.  Respiratory: Negative for cough, chest tightness, shortness of breath and wheezing.   Cardiovascular: Negative for chest pain.  Gastrointestinal: Negative for abdominal pain, constipation and diarrhea.  Musculoskeletal: Positive for arthralgias, gait problem and myalgias.  Neurological: Positive for tingling. Negative for dizziness, light-headedness and headaches.  Psychiatric/Behavioral: Positive for depression. Negative for suicidal ideas. The patient has insomnia.     Patient Active Problem List   Diagnosis Date Noted  . S/P hip replacement, right 08/08/2017  . Atherosclerosis of native arteries of extremity with intermittent claudication ([place]) 01/13/2017  . Constipation 07/26/2015  . Tobacco use disorder 07/26/2015  . Insomnia 07/26/2015  . Underweight 07/26/2015  . Ischemic colitis ([place]) 11/07/2014    Allergies  Allergen Reactions  . Codeine Other (See Comments)    Family history of codeine allergy with rash  . Morphine And Related     Family history of morphine reaction with rash  . Morphine Nausea Only    Decreases BP    Past Surgical History:  Procedure Laterality Date  . COLONOSCOPY    . SIGMOIDOSCOPY    . TOTAL HIP ARTHROPLASTY Right 10/26/2017   Procedure: TOTAL HIP ARTHROPLASTY;  Surgeon: Dereck Leep, MD;  Location: [facility] ORS;  Service: Orthopedics;  Laterality: Right;    Social  History   Tobacco Use  . Smoking status: Current Every Day Smoker    Packs/day: 0.10    Types: Cigarettes  . Smokeless tobacco: Never Used  . Tobacco comment: 2 - 3 cigerettes a day  Substance Use Topics  . Alcohol use: Yes    Alcohol/week: 2.0 standard drinks    Types: 2 Cans of beer per week    Comment: occasional  . Drug use: No     Medication list has been reviewed and updated.  Current Meds  Medication Sig  . gabapentin (NEURONTIN) 300 MG capsule Take  1 capsule by mouth 3 (three) times daily.    PHQ 2/9 Scores 11/01/2019 12/02/2017 05/29/2015  PHQ - 2 Score 4 0 0  PHQ- 9 Score 9 0 -   GAD 7 : Generalized Anxiety Score 11/01/2019  Nervous, Anxious, on Edge 1  Control/stop worrying 2  Worry too much - different things 2  Trouble relaxing 1  Restless 3  Easily annoyed or irritable 0  Afraid - awful might happen 3  Total GAD 7 Score 12  Anxiety Difficulty Very difficult      BP Readings from Last 3 Encounters:  11/01/19 112/78  11/11/18 139/80  11/09/18 137/89    Physical Exam Vitals and nursing note reviewed.  Constitutional:      General: She is not in acute distress.    Appearance: She is well-developed.  HENT:     Head: Normocephalic and atraumatic.  Cardiovascular:     Rate and Rhythm: Normal rate and regular rhythm.     Pulses: Normal pulses.     Heart sounds: No murmur.  Pulmonary:     Effort: Pulmonary effort is normal. No respiratory distress.     Breath sounds: Wheezing present. No rhonchi.  Musculoskeletal:     Cervical back: Normal range of motion.     Right lower leg: No edema.     Left lower leg: No edema.     Right foot: Tenderness (over distal forefoot and sole) present. No swelling. Normal pulse.     Left foot: Normal. Normal pulse.  Skin:    General: Skin is warm and dry.     Capillary Refill: Capillary refill takes less than 2 seconds.     Findings: No rash.  Neurological:     General: No focal deficit present.     Mental Status: She is alert and oriented to person, place, and time.  Psychiatric:        Behavior: Behavior normal.        Thought Content: Thought content normal.     Wt Readings from Last 3 Encounters:  11/01/19 164 lb (74.4 kg)  11/11/18 140 lb (63.5 kg)  11/09/18 140 lb (63.5 kg)    BP 112/78   Pulse 86   Temp 98.2 F (36.8 C) (Oral)   Ht 5\' 6"  (1.676 m)   Wt 164 lb (74.4 kg)   SpO2 98%   BMI 26.47 kg/m   Assessment and Plan: 1. Foot pain, right Suspect Morton's  Neuroma - will refer to Podiatry - Ambulatory referral to Podiatry  2. Muscle cramps Check K+ and Mg++ and advise on supplementation - Magnesium - Basic metabolic panel  3. Moderate single current episode of major depressive disorder ([place]) New onset due to multiple factors - mostly due to caring for elderly mother and fathers recent death 19 patient's request for Clonazepam - not indicated for depression and is controlled Will start lexapro and assess response  at follow up appt in one month - escitalopram (LEXAPRO) 10 MG tablet; Take 1 tablet (10 mg total) by mouth at bedtime.  Dispense: 30 tablet; Refill: 1   Partially dictated using Editor, commissioning. Any errors are unintentional.  Halina Maidens, MD [facility]  11/01/2019

## 2019-11-02 LAB — BASIC METABOLIC PANEL
BUN/Creatinine Ratio: 29 — ABNORMAL HIGH (ref 9–23)
BUN: 30 mg/dL — ABNORMAL HIGH (ref 6–24)
CO2: 22 mmol/L (ref 20–29)
Calcium: 9.4 mg/dL (ref 8.7–10.2)
Chloride: 105 mmol/L (ref 96–106)
Creatinine, Ser: 1.04 mg/dL — ABNORMAL HIGH (ref 0.57–1.00)
GFR calc Af Amer: 69 mL/min/{1.73_m2} (ref >59)
GFR calc non Af Amer: 60 mL/min/{1.73_m2} (ref >59)
Glucose: 79 mg/dL (ref 65–99)
Potassium: 4.2 mmol/L (ref 3.5–5.2)
Sodium: 139 mmol/L (ref 134–144)

## 2019-11-02 LAB — MAGNESIUM: Magnesium: 2.2 mg/dL (ref 1.6–2.3)

## 2019-11-27 DIAGNOSIS — M5126 Other intervertebral disc displacement, lumbar region: Secondary | ICD-10-CM | POA: Insufficient documentation

## 2019-11-28 ENCOUNTER — Encounter: Payer: 59 | Admitting: Internal Medicine

## 2019-12-03 ENCOUNTER — Encounter: Payer: Self-pay | Admitting: Emergency Medicine

## 2019-12-03 ENCOUNTER — Ambulatory Visit
Admission: EM | Admit: 2019-12-03 | Discharge: 2019-12-03 | Disposition: A | Discharge status: Home / Self Care | Payer: 59 | Attending: Family Medicine | Admitting: Family Medicine

## 2019-12-03 ENCOUNTER — Other Ambulatory Visit: Payer: Self-pay

## 2019-12-03 DIAGNOSIS — Z885 Allergy status to narcotic agent status: Secondary | ICD-10-CM | POA: Insufficient documentation

## 2019-12-03 DIAGNOSIS — R490 Dysphonia: Secondary | ICD-10-CM | POA: Diagnosis not present

## 2019-12-03 DIAGNOSIS — J029 Acute pharyngitis, unspecified: Secondary | ICD-10-CM

## 2019-12-03 DIAGNOSIS — J040 Acute laryngitis: Secondary | ICD-10-CM | POA: Insufficient documentation

## 2019-12-03 DIAGNOSIS — Z8249 Family history of ischemic heart disease and other diseases of the circulatory system: Secondary | ICD-10-CM | POA: Diagnosis not present

## 2019-12-03 DIAGNOSIS — Z79899 Other long term (current) drug therapy: Secondary | ICD-10-CM | POA: Diagnosis not present

## 2019-12-03 DIAGNOSIS — Z20822 Contact with and (suspected) exposure to covid-19: Secondary | ICD-10-CM | POA: Insufficient documentation

## 2019-12-03 DIAGNOSIS — F17210 Nicotine dependence, cigarettes, uncomplicated: Secondary | ICD-10-CM | POA: Diagnosis not present

## 2019-12-03 DIAGNOSIS — Z8349 Family history of other endocrine, nutritional and metabolic diseases: Secondary | ICD-10-CM | POA: Insufficient documentation

## 2019-12-03 LAB — GROUP A STREP BY PCR: Group A Strep by PCR: NOT DETECTED

## 2019-12-03 MED ORDER — LIDOCAINE VISCOUS HCL 2 % MT SOLN: OROMUCOSAL | 0 refills | Status: DC

## 2019-12-03 NOTE — ED Triage Notes (Signed)
Pt c/o sore throat, hoarseness, loss of taste and headache. Started about 2 days ago. Denies fever, body aches, chills or cough.

## 2019-12-03 NOTE — Discharge Instructions (Signed)
Voice rest.  Fluids.  Medication if needed.  Take care  Dr. Lacinda Axon

## 2019-12-03 NOTE — ED Provider Notes (Signed)
[facility]    CSN: [id number] Arrival date & time: 12/03/19  [id number]      History   Chief Complaint Chief Complaint  Patient presents with  . Sore Throat  . Hoarse   HPI   59 year old female presents with hoarseness and mild sore throat.  Started 2 days ago.  Reports loss of voice and mild sore throat.  She reported loss of taste and headache to the nurse.  She did not report these to me when asked about symptoms.  No fever.  No relieving factors.  No known exacerbating factors.  No other associated symptoms.  No other complaints.  Past Medical History:  Diagnosis Date  . Ischemic colitis [facility])    Patient Active Problem List   Diagnosis Date Noted  . HNP (herniated nucleus pulposus), lumbar 11/27/2019  . Moderate single current episode of major depressive disorder ([place]) 11/01/2019  . Foot pain, right 11/01/2019  . Muscle cramps 11/01/2019  . S/P hip replacement, right 08/08/2017  . Atherosclerosis of native arteries of extremity with intermittent claudication ([place]) 01/13/2017  . Constipation 07/26/2015  . Tobacco use disorder 07/26/2015  . Insomnia 07/26/2015  . Underweight 07/26/2015  . Ischemic colitis ([place]) 11/07/2014    Past Surgical History:  Procedure Laterality Date  . COLONOSCOPY    . SIGMOIDOSCOPY    . TOTAL HIP ARTHROPLASTY Right 10/26/2017   Procedure: TOTAL HIP ARTHROPLASTY;  Surgeon: Dereck Leep, MD;  Location: [facility] ORS;  Service: Orthopedics;  Laterality: Right;    OB History   No obstetric history on file.      Home Medications    Prior to Admission medications   Medication Sig Start Date End Date Taking? Authorizing Provider  escitalopram (LEXAPRO) 10 MG tablet Take 1 tablet (10 mg total) by mouth at bedtime. 11/01/19  Yes Glean Hess, MD  gabapentin (NEURONTIN) 300 MG capsule Take 1 capsule by mouth 3 (three) times daily. 09/26/19 09/25/20  [provider]  lidocaine (XYLOCAINE) 2 % solution Gargle 15 mL every 3  hours as needed. May swallow if desired. 12/03/19   Coral Spikes, DO    Family History Family History  Problem Relation Age of Onset  . Hyperlipidemia Mother   . Hyperlipidemia Father   . Heart disease Father     Social History Social History   Tobacco Use  . Smoking status: Current Every Day Smoker    Packs/day: 0.10    Types: Cigarettes  . Smokeless tobacco: Never Used  . Tobacco comment: 2 - 3 cigerettes a day  Substance Use Topics  . Alcohol use: Yes    Alcohol/week: 2.0 standard drinks    Types: 2 Cans of beer per week    Comment: occasional  . Drug use: No     Allergies   Codeine, Morphine and related, and Morphine   Review of Systems Review of Systems  Constitutional: Negative for fever.  HENT: Positive for sore throat and voice change.    Physical Exam Triage Vital Signs ED Triage Vitals  Enc Vitals Group     BP 12/03/19 0840 (!) 134/101     Pulse Rate 12/03/19 0840 76     Resp 12/03/19 0840 18     Temp 12/03/19 0840 98.8 F (37.1 C)     Temp Source 12/03/19 0840 Oral     SpO2 12/03/19 0840 100 %     Weight 12/03/19 0834 150 lb (68 kg)     Height 12/03/19 0834 5\' 6"  (  1.676 m)     Head Circumference --      Peak Flow --      Pain Score 12/03/19 0834 6     Pain Loc --      Pain Edu? --      Excl. in [place]? --    Updated Vital Signs BP (!) 134/101 (BP Location: Left Arm)   Pulse 76   Temp 98.8 F (37.1 C) (Oral)   Resp 18   Ht 5\' 6"  (1.676 m)   Wt 68 kg   SpO2 100%   BMI 24.21 kg/m   Visual Acuity Right Eye Distance:   Left Eye Distance:   Bilateral Distance:    Right Eye Near:   Left Eye Near:    Bilateral Near:     Physical Exam Vitals and nursing note reviewed.  Constitutional:      General: She is not in acute distress.    Appearance: Normal appearance. She is not ill-appearing.  HENT:     Head: Normocephalic and atraumatic.     Mouth/Throat:     Pharynx: No oropharyngeal exudate.  Eyes:     General:        Right eye: No  discharge.        Left eye: No discharge.     Conjunctiva/sclera: Conjunctivae normal.  Cardiovascular:     Rate and Rhythm: Normal rate and regular rhythm.  Pulmonary:     Effort: Pulmonary effort is normal.     Breath sounds: Normal breath sounds. No wheezing or rales.  Neurological:     Mental Status: She is alert.  Psychiatric:        Mood and Affect: Mood normal.        Behavior: Behavior normal.    UC Treatments / Results  Labs (all labs ordered are listed, but only abnormal results are displayed) Labs Reviewed  GROUP A STREP BY PCR  SARS CORONAVIRUS 2 (TAT 6-24 HRS)    EKG   Radiology No results found.  Procedures Procedures (including critical care time)  Medications Ordered in UC Medications - No data to display  Initial Impression / Assessment and Plan / UC Course  I have reviewed the triage vital signs and the nursing notes.  Pertinent labs & imaging results that were available during my care of the patient were reviewed by me and considered in my medical decision making (see chart for details).    59 year old female presents with laryngitis.  Strep negative.  Awaiting Covid test.  I suspect that this is just viral laryngitis.  Advised voice rest.  Viscous Lidocaine if needed.  Supportive care.  Final Clinical Impressions(s) / UC Diagnoses   Final diagnoses:  Laryngitis     Discharge Instructions     Voice rest.  Fluids.  Medication if needed.  Take care  Dr. Lacinda Axon    ED Prescriptions    Medication Sig Dispense Auth. Provider   lidocaine (XYLOCAINE) 2 % solution Gargle 15 mL every 3 hours as needed. May swallow if desired. 200 mL Coral Spikes, DO     PDMP not reviewed this encounter.   Thersa Salt [place], [place] 12/03/19 [phone]

## 2019-12-04 LAB — SARS CORONAVIRUS 2 (TAT 6-24 HRS): SARS Coronavirus 2: NEGATIVE

## 2019-12-23 ENCOUNTER — Other Ambulatory Visit: Payer: Self-pay | Admitting: Family Medicine

## 2019-12-23 ENCOUNTER — Ambulatory Visit
Admission: RE | Admit: 2019-12-23 | Discharge: 2019-12-23 | Disposition: A | Discharge status: Home / Self Care | Payer: No Typology Code available for payment source | Attending: Family Medicine | Admitting: Family Medicine

## 2019-12-23 ENCOUNTER — Ambulatory Visit
Admission: RE | Admit: 2019-12-23 | Discharge: 2019-12-23 | Disposition: A | Discharge status: Home / Self Care | Payer: No Typology Code available for payment source | Source: Ambulatory Visit | Attending: Family Medicine | Admitting: Family Medicine

## 2019-12-23 DIAGNOSIS — M7989 Other specified soft tissue disorders: Secondary | ICD-10-CM | POA: Insufficient documentation

## 2019-12-23 DIAGNOSIS — M79632 Pain in left forearm: Secondary | ICD-10-CM | POA: Insufficient documentation

## 2019-12-23 DIAGNOSIS — T148XXA Other injury of unspecified body region, initial encounter: Secondary | ICD-10-CM | POA: Insufficient documentation

## 2019-12-30 ENCOUNTER — Other Ambulatory Visit: Payer: Self-pay | Admitting: Internal Medicine

## 2019-12-30 DIAGNOSIS — F321 Major depressive disorder, single episode, moderate: Secondary | ICD-10-CM

## 2020-01-19 NOTE — Telephone Encounter (Signed)
Pt states she did not "no show" her CPE appt, spoke to someone about not having her ins card. Said she will discuss it with dr. Army Melia and hung up and did not reschedule her appt.

## 2020-04-24 ENCOUNTER — Encounter: Payer: Self-pay | Admitting: Internal Medicine

## 2020-04-24 ENCOUNTER — Ambulatory Visit: Payer: 59 | Admitting: Internal Medicine

## 2020-04-24 ENCOUNTER — Other Ambulatory Visit: Payer: Self-pay

## 2020-04-24 VITALS — BP 128/78 | HR 84 | Ht 66.0 in | Wt 167.0 lb

## 2020-04-24 DIAGNOSIS — Z1211 Encounter for screening for malignant neoplasm of colon: Secondary | ICD-10-CM | POA: Diagnosis not present

## 2020-04-24 DIAGNOSIS — K5900 Constipation, unspecified: Secondary | ICD-10-CM

## 2020-04-24 DIAGNOSIS — F5101 Primary insomnia: Secondary | ICD-10-CM

## 2020-04-24 MED ORDER — TRAZODONE HCL 50 MG PO TABS: 25.0000 mg | ORAL_TABLET | Freq: Every day | ORAL | 0 refills | Status: DC

## 2020-04-24 NOTE — Patient Instructions (Signed)
Linzess 72 mcg - take one tablet daily.  You can adjust the dose down to every other day or every third day to get a regular stool.

## 2020-04-24 NOTE — Progress Notes (Signed)
Date:  04/24/2020   Name:  Yvette Barr   DOB:  02/19/1961   MRN:  [id number]   Chief Complaint: Abdominal Pain (Painful and bloated on and off for 1 week. Feels like swollen and consitpated. Has not had a good BM in a while. )  Constipation This is a recurrent problem. The current episode started more than 1 year ago. The problem has been gradually worsening since onset. Her stool frequency is 1 time per week or less. The stool is described as pellet like. The patient is on a high fiber diet. She exercises regularly. There has been adequate water intake. Pertinent negatives include no nausea, rectal pain or vomiting.  Insomnia Primary symptoms: sleep disturbance, difficulty falling asleep.  The problem occurs intermittently. The problem is unchanged. The symptoms are aggravated by anxiety and work stress. Treatments tried: over the counter medication.   She did not get a colonoscopy last year due to Covid.  Lab Results  Component Value Date   CREATININE 1.04 (H) 11/01/2019   BUN 30 (H) 11/01/2019   NA 139 11/01/2019   K 4.2 11/01/2019   CL 105 11/01/2019   CO2 22 11/01/2019   Lab Results  Component Value Date   CHOL 139 01/25/2016   HDL 61 01/25/2016   LDLCALC 69 01/25/2016   TRIG 44 01/25/2016   CHOLHDL 2.3 01/25/2016   Lab Results  Component Value Date   TSH 1.740 01/25/2016   No results found for: HGBA1C Lab Results  Component Value Date   WBC 6.3 10/14/2017   HGB 13.9 10/14/2017   HCT 41.5 10/14/2017   MCV 95.3 10/14/2017   PLT 238 10/14/2017   Lab Results  Component Value Date   ALT 16 10/14/2017   AST 25 10/14/2017   ALKPHOS 61 10/14/2017   BILITOT 0.3 10/14/2017     Review of Systems  Constitutional: Negative for appetite change, fatigue and unexpected weight change.  Respiratory: Negative for chest tightness.   Cardiovascular: Negative for chest pain and leg swelling.  Gastrointestinal: Positive for abdominal distention and constipation. Negative  for anal bleeding, nausea, rectal pain and vomiting.  Neurological: Negative for dizziness and headaches.  Psychiatric/Behavioral: Positive for sleep disturbance. Negative for dysphoric mood. The patient is nervous/anxious and has insomnia.     Patient Active Problem List   Diagnosis Date Noted  . HNP (herniated nucleus pulposus), lumbar 11/27/2019  . Moderate single current episode of major depressive disorder ([place]) 11/01/2019  . Foot pain, right 11/01/2019  . Muscle cramps 11/01/2019  . S/P hip replacement, right 08/08/2017  . Atherosclerosis of native arteries of extremity with intermittent claudication ([place]) 01/13/2017  . Constipation 07/26/2015  . Tobacco use disorder 07/26/2015  . Insomnia 07/26/2015  . Underweight 07/26/2015  . Ischemic colitis ([place]) 11/07/2014    Allergies  Allergen Reactions  . Codeine Other (See Comments)    Family history of codeine allergy with rash  . Morphine And Related     Family history of morphine reaction with rash  . Morphine Nausea Only    Decreases BP    Past Surgical History:  Procedure Laterality Date  . COLONOSCOPY    . SIGMOIDOSCOPY    . TOTAL HIP ARTHROPLASTY Right 10/26/2017   Procedure: TOTAL HIP ARTHROPLASTY;  Surgeon: Dereck Leep, MD;  Location: [facility] ORS;  Service: Orthopedics;  Laterality: Right;    Social History   Tobacco Use  . Smoking status: Current Every Day Smoker    Packs/day: 0.10  Types: Cigarettes  . Smokeless tobacco: Never Used  . Tobacco comment: 2 - 3 cigerettes a day  Vaping Use  . Vaping Use: Never used  Substance Use Topics  . Alcohol use: Yes    Alcohol/week: 2.0 standard drinks    Types: 2 Cans of beer per week    Comment: occasional  . Drug use: No     Medication list has been reviewed and updated.  No outpatient medications have been marked as taking for the 04/24/20 encounter (Office Visit) with Glean Hess, MD.    [facility] 2/9 Scores 04/24/2020 11/01/2019 12/02/2017 05/29/2015  PHQ -  2 Score 0 4 0 0  PHQ- 9 Score 9 9 0 -    GAD 7 : Generalized Anxiety Score 04/24/2020 11/01/2019  Nervous, Anxious, on Edge 0 1  Control/stop worrying 0 2  Worry too much - different things 0 2  Trouble relaxing 0 1  Restless 0 3  Easily annoyed or irritable 0 0  Afraid - awful might happen 0 3  Total GAD 7 Score 0 12  Anxiety Difficulty Not difficult at all Very difficult    BP Readings from Last 3 Encounters:  04/24/20 128/78  12/03/19 (!) 134/101  11/01/19 112/78    Physical Exam Vitals and nursing note reviewed.  Constitutional:      General: She is not in acute distress.    Appearance: She is well-developed.  HENT:     Head: Normocephalic and atraumatic.  Pulmonary:     Effort: Pulmonary effort is normal. No respiratory distress.  Abdominal:     General: Abdomen is protuberant. Bowel sounds are decreased.     Palpations: Abdomen is soft. There is no hepatomegaly or splenomegaly.     Tenderness: There is no abdominal tenderness.  Musculoskeletal:        General: Normal range of motion.  Skin:    General: Skin is warm and dry.     Findings: No rash.  Neurological:     Mental Status: She is alert and oriented to person, place, and time.  Psychiatric:        Behavior: Behavior normal.        Thought Content: Thought content normal.     Wt Readings from Last 3 Encounters:  04/24/20 167 lb (75.8 kg)  12/03/19 150 lb (68 kg)  11/01/19 164 lb (74.4 kg)    BP 128/78   Pulse 84   Ht 5\' 6"  (1.676 m)   Wt 167 lb (75.8 kg)   SpO2 98%   BMI 26.95 kg/m   Assessment and Plan: 1. Constipation, unspecified constipation type Will begin Linzess 72 mcg daily - adjust dose downward to achieve a bowel movement 1-2 times per day   2. Colon cancer screening Overdue for screening - Ambulatory referral to Gastroenterology  3. Primary insomnia Did not like Lexapro for anxiety - traZODone (DESYREL) 50 MG tablet; Take 0.5-1 tablets (25-50 mg total) by mouth at bedtime.   Dispense: 30 tablet; Refill: 0   Partially dictated using Editor, commissioning. Any errors are unintentional.  Halina Maidens, MD [facility]  04/24/2020

## 2020-05-01 ENCOUNTER — Telehealth: Payer: Self-pay | Admitting: Internal Medicine

## 2020-05-01 NOTE — Telephone Encounter (Signed)
Copied from [place] [id number]. Topic: General - Other >> Apr 30, 2020  4:45 PM Gillis Ends D wrote: Reason for CRM: Patient is requesting that her linzess is increased so she can get some release. She spoke to the nurse at the [facility] in [facility] and she suggested the increase. Please call her back. Please advise

## 2020-05-05 ENCOUNTER — Emergency Department: Payer: 59

## 2020-05-05 ENCOUNTER — Emergency Department
Admission: EM | Admit: 2020-05-05 | Discharge: 2020-05-05 | Disposition: A | Discharge status: Home / Self Care | Payer: 59 | Attending: Emergency Medicine | Admitting: Emergency Medicine

## 2020-05-05 DIAGNOSIS — K5900 Constipation, unspecified: Secondary | ICD-10-CM | POA: Diagnosis not present

## 2020-05-05 DIAGNOSIS — F17210 Nicotine dependence, cigarettes, uncomplicated: Secondary | ICD-10-CM | POA: Insufficient documentation

## 2020-05-05 MED ORDER — GLYCERIN (ADULT) 2 G RE SUPP: 1.0000 | RECTAL | 0 refills | Status: DC | PRN

## 2020-05-05 MED ORDER — POLYETHYLENE GLYCOL 3350 17 G PO PACK
17.0000 g | PACK | Freq: Once | ORAL | Status: AC
  Administered 2020-05-05: 17 g via ORAL
  Filled 2020-05-05: qty 1

## 2020-05-05 MED ORDER — LACTULOSE 10 GM/15ML PO SOLN: 20.0000 g | Freq: Two times a day (BID) | ORAL | 0 refills | Status: DC | PRN

## 2020-05-05 MED ORDER — GLYCERIN (LAXATIVE) 2.1 G RE SUPP
1.0000 | Freq: Once | RECTAL | Status: AC
  Administered 2020-05-05: 1 via RECTAL
  Filled 2020-05-05: qty 1

## 2020-05-05 MED ORDER — SORBITOL 70 % SOLN
960.0000 mL | TOPICAL_OIL | Freq: Once | ORAL | Status: AC
  Administered 2020-05-05: 960 mL via RECTAL
  Filled 2020-05-05: qty 473

## 2020-05-05 MED ORDER — MAGNESIUM CITRATE PO SOLN
1.0000 | Freq: Once | ORAL | Status: AC
  Administered 2020-05-05: 1 via ORAL
  Filled 2020-05-05: qty 296

## 2020-05-05 NOTE — ED Notes (Signed)
Called pharmacy for enema

## 2020-05-05 NOTE — ED Notes (Signed)
Pt c/o mid abd pain  Pt states she saw PCP a week ago and was treated for constipation Pt last had BM 1-2 weeks ago  Pt is able to pass gas

## 2020-05-05 NOTE — ED Provider Notes (Signed)
[facility] Emergency Department Provider Note ____________________________________________  Time seen: 1035  I have reviewed the triage vital signs and the nursing notes.  HISTORY  Chief Complaint  Constipation  HPI Yvette Barr is a 59 y.o. female presents herself to the ED for evaluation of constipation.  Patient describes greater than a week of having her last normal stool.  She denies any significant nominal pain, nausea, vomiting, or diarrhea.  She has started on Linzess, as provided by her PCP, but has had a meaningful stool.  Patient has not taken any medications over-the-counter to help alleviate her symptoms patient denies any history of bowel obstruction, ileus, or colitis.  She does give a history of intermittent, recurrent constipation.   She denies any fever, chills, chest pain, or syncope.  Past Medical History:  Diagnosis Date  . Ischemic colitis [facility])     Patient Active Problem List   Diagnosis Date Noted  . HNP (herniated nucleus pulposus), lumbar 11/27/2019  . Moderate single current episode of major depressive disorder ([place]) 11/01/2019  . Foot pain, right 11/01/2019  . Muscle cramps 11/01/2019  . S/P hip replacement, right 08/08/2017  . Constipation 07/26/2015  . Tobacco use disorder 07/26/2015  . Insomnia 07/26/2015  . Underweight 07/26/2015    Past Surgical History:  Procedure Laterality Date  . COLONOSCOPY    . SIGMOIDOSCOPY    . TOTAL HIP ARTHROPLASTY Right 10/26/2017   Procedure: TOTAL HIP ARTHROPLASTY;  Surgeon: Dereck Leep, MD;  Location: [facility] ORS;  Service: Orthopedics;  Laterality: Right;    Prior to Admission medications   Medication Sig Start Date End Date Taking? Authorizing Provider  glycerin adult 2 g suppository Place 1 suppository rectally as needed for constipation. 05/05/20   Zakariah Dejarnette, Dannielle Karvonen, PA-C  lactulose (CHRONULAC) 10 GM/15ML solution Take 30 mLs (20 g total) by mouth 2 (two) times daily as needed  for up to 4 days for mild constipation. 05/05/20 05/09/20  Anhar Mcdermott, Dannielle Karvonen, PA-C  traZODone (DESYREL) 50 MG tablet Take 0.5-1 tablets (25-50 mg total) by mouth at bedtime. 04/24/20   Glean Hess, MD    Allergies Codeine, Morphine and related, and Morphine  Family History  Problem Relation Age of Onset  . Hyperlipidemia Mother   . Hyperlipidemia Father   . Heart disease Father     Social History Social History   Tobacco Use  . Smoking status: Current Every Day Smoker    Packs/day: 0.10    Types: Cigarettes  . Smokeless tobacco: Never Used  . Tobacco comment: 2 - 3 cigerettes a day  Vaping Use  . Vaping Use: Never used  Substance Use Topics  . Alcohol use: Yes    Alcohol/week: 2.0 standard drinks    Types: 2 Cans of beer per week    Comment: occasional  . Drug use: No    Review of Systems  Constitutional: Negative for fever. Cardiovascular: Negative for chest pain. Respiratory: Negative for shortness of breath. Gastrointestinal: Negative for abdominal pain, vomiting and diarrhea.  Reports constipation as above. Genitourinary: Negative for dysuria. Musculoskeletal: Negative for back pain. Skin: Negative for rash. Neurological: Negative for headaches, focal weakness or numbness. ____________________________________________  PHYSICAL EXAM:  VITAL SIGNS: ED Triage Vitals  Enc Vitals Group     BP 05/05/20 0659 (!) 140/115     Pulse Rate 05/05/20 0659 74     Resp 05/05/20 0659 18     Temp 05/05/20 0659 (!) 97 F (36.1 C)  Temp Source 05/05/20 0659 Oral     SpO2 05/05/20 0659 97 %     Weight 05/05/20 0700 166 lb (75.3 kg)     Height 05/05/20 0700 5\' 6"  (1.676 m)     Head Circumference --      Peak Flow --      Pain Score 05/05/20 0700 5     Pain Loc --      Pain Edu? --      Excl. in [place]? --     Constitutional: Alert and oriented. Well appearing and in no distress. Head: Normocephalic and atraumatic. Eyes: Conjunctivae are normal. Normal  extraocular movements Cardiovascular: Normal rate, regular rhythm. Normal distal pulses. Respiratory: Normal respiratory effort. No wheezes/rales/rhonchi. Gastrointestinal: Soft and nontender. No distention, rebound, guarding, or rigidity.  Normoactive bowel sounds appreciated.  No CVA tenderness noted. Musculoskeletal: Nontender with normal range of motion in all extremities.  Neurologic:  Normal gait without ataxia. Normal speech and language. No gross focal neurologic deficits are appreciated. Skin:  Skin is warm, dry and intact. No rash noted. Psychiatric: Mood and affect are normal. Patient exhibits appropriate insight and judgment. ____________________________________________   RADIOLOGY  DG ABD Acute w/ Chest  IMPRESSION: 1. Large amount of stool in the abdomen and pelvis. Findings are compatible with history of constipation. Nonobstructive bowel gas pattern. 2. Densities at the left lung base are most compatible with atelectasis. ____________________________________________  PROCEDURES  Magnesium citrate 1 bottle PEG 17 g PO Glycerin suppository - PR SMOG enema  Procedures ____________________________________________  INITIAL IMPRESSION / ASSESSMENT AND PLAN / ED COURSE  DDX: constipation, colitis, SBO, ileus  Patient with ED evaluation of better than a week of constipation.  Patient reports bloating without significant abdominal pain, nausea, vomiting, or rectal pain/bleeding.   Patient with constipation x 1+ weeks presents for treatement. She had a meaningful stool after PEG, mag citrate, glycerin suppository, and SMOG enema. She will continue with bowel clean-out using PEG x BID and clear liquid diet until she feels relief. She will be discharged with lactulose and glycerin suppository RXs. Return precautions have been reviewed.   Yvette Barr was evaluated in Emergency Department on 05/05/2020 for the symptoms described in the history of present illness. She was  evaluated in the context of the global COVID-19 pandemic, which necessitated consideration that the patient might be at risk for infection with the SARS-CoV-2 virus that causes COVID-19. Institutional protocols and algorithms that pertain to the evaluation of patients at risk for COVID-19 are in a state of rapid change based on information released by regulatory bodies including the CDC and federal and state organizations. These policies and algorithms were followed during the patient's care in the ED. ____________________________________________  FINAL CLINICAL IMPRESSION(S) / ED DIAGNOSES  Final diagnoses:  Constipation, unspecified constipation type      Melvenia Needles, PA-C 05/05/20 1906    Harvest Dark, MD 05/06/20 1137

## 2020-05-05 NOTE — ED Notes (Signed)
Given prune juice and jello.

## 2020-05-05 NOTE — ED Triage Notes (Signed)
Patient to ED for complaints of constipation for greater than a week. States she has got to have some relief.

## 2020-05-05 NOTE — Discharge Instructions (Addendum)
You are being treated for constipation. There is no evidence of bowel obstruction. Take OTC Miralax (17g) twice daily until you have a normal stool. You may also repeat the OTC magnesium citrate in 1-2 days. You should also consider a clear liquid diet until you have a normal stool. Avoid meats, cheeses, breads, or starchy foods until you have a normal stool. Follow-up with your provider or return if needed.

## 2020-05-05 NOTE — ED Notes (Signed)
Pt has ambulated around flex ED and sat on toilet, unable to have bowel movement. PA notified.

## 2020-05-05 NOTE — ED Notes (Signed)
Pt in bathroom

## 2020-05-17 ENCOUNTER — Other Ambulatory Visit: Payer: Self-pay | Admitting: Internal Medicine

## 2020-05-17 DIAGNOSIS — F5101 Primary insomnia: Secondary | ICD-10-CM

## 2020-05-18 ENCOUNTER — Telehealth: Payer: Self-pay | Admitting: Internal Medicine

## 2020-05-18 ENCOUNTER — Other Ambulatory Visit: Payer: Self-pay

## 2020-05-18 DIAGNOSIS — K5901 Slow transit constipation: Secondary | ICD-10-CM

## 2020-05-18 MED ORDER — LINACLOTIDE 290 MCG PO CAPS: 290.0000 ug | ORAL_CAPSULE | Freq: Every day | ORAL | 5 refills | Status: DC

## 2020-05-18 NOTE — Telephone Encounter (Signed)
Pt asked about having the Rx for Liness called in/ please advise   Pt stated it was called liness but not sure if she meant Linzess /please advise

## 2020-05-18 NOTE — Telephone Encounter (Signed)
[facility] [place].  CM

## 2020-05-28 ENCOUNTER — Telehealth: Payer: Self-pay | Admitting: Internal Medicine

## 2020-05-28 NOTE — Telephone Encounter (Unsigned)
Copied from [place] [id number]. Topic: General - Other >> May 25, 2020  4:44 PM Antonieta Iba C wrote: Reason for CRM: pt called in to request a Rx for Lactulose 10 GM/ 15 ML solution, pt says that she received a Rx from the hospital but they didn't give her enough   [id number] [place] [address]  Phone:  [phone] Fax:  [phone]

## 2020-05-28 NOTE — Telephone Encounter (Signed)
Called pt left VM to call back. Need to know if pt picked up the medication Linzess which is also used for constipation which was [facility] [place].  Will route result note to [facility] Nurse Triage for follow up when patient returns call to clinic.      KP

## 2020-05-28 NOTE — Telephone Encounter (Signed)
She had called after the ED visit and asked for Linzess.  Did she pick that up and start taking it?  I can refill the lactulose if needed as well but need information about her response to Linzess.

## 2020-05-28 NOTE — Telephone Encounter (Signed)
Attempted to call pt.  Left vm to return call to office.  

## 2020-05-28 NOTE — Telephone Encounter (Signed)
Please Advise. ED visit 05/05/2020.  KP

## 2020-05-29 NOTE — Telephone Encounter (Unsigned)
Copied from [place] [id number]. Topic: General - Inquiry >> May 29, 2020  1:53 PM Alease Frame wrote: Reason for CRM: Pt returned call from office . She has confirmed that she has picked up medication Liz[place] . Please advise

## 2020-05-30 ENCOUNTER — Other Ambulatory Visit: Payer: Self-pay | Admitting: Internal Medicine

## 2020-05-30 DIAGNOSIS — K5900 Constipation, unspecified: Secondary | ICD-10-CM

## 2020-05-30 MED ORDER — LACTULOSE 10 GM/15ML PO SOLN: 20.0000 g | Freq: Two times a day (BID) | ORAL | 0 refills | Status: AC | PRN

## 2020-05-30 NOTE — Telephone Encounter (Signed)
So she picked up [place] but did she take it and did it help?  And if so, why does she need lactulose - we don't usually keep patients on that long term.

## 2020-05-30 NOTE — Telephone Encounter (Signed)
I refilled the Lactulose but she needs to be sure the keep the GI appt later this month.

## 2020-05-30 NOTE — Telephone Encounter (Signed)
Called pt left VM that that a RF for Lactulose was sent in to make sure that she does to her appt for GI later this month. Pts name was stated on VM.  KP

## 2020-05-30 NOTE — Telephone Encounter (Signed)
Please Advise. Pt has picked up [place].  KP

## 2020-05-30 NOTE — Telephone Encounter (Signed)
Pt took [place] but it did not work. Linzess it made her bloated. Pt said Lactulose seems to "clean her out better"  Pt gave permission to leave a detailed VM when calling back.   KP

## 2020-06-22 ENCOUNTER — Encounter: Payer: 59 | Admitting: Internal Medicine

## 2020-06-22 NOTE — Progress Notes (Deleted)
Date:  06/22/2020   Name:  Yvette Barr   DOB:  06/06/1961   MRN:  [id number]   Chief Complaint: No chief complaint on file.  Yvette Barr is a 59 y.o. female who presents today for her Complete Annual Exam. She feels {DESC; WELL/FAIRLY WELL/POORLY:18703}. She reports exercising ***. She reports she is sleeping {DESC; WELL/FAIRLY WELL/POORLY:18703}. Breast complaints ***.  Mammogram: none DEXA: none Pap smear: 12/2015 neg with + HPV Colonoscopy: none  Immunization History  Administered Date(s) Administered  . Influenza,inj,Quad PF,6+ Mos 10/27/2017  . Influenza-Unspecified 07/01/2015    HPI  Lab Results  Component Value Date   CREATININE 1.04 (H) 11/01/2019   BUN 30 (H) 11/01/2019   NA 139 11/01/2019   K 4.2 11/01/2019   CL 105 11/01/2019   CO2 22 11/01/2019   Lab Results  Component Value Date   CHOL 139 01/25/2016   HDL 61 01/25/2016   LDLCALC 69 01/25/2016   TRIG 44 01/25/2016   CHOLHDL 2.3 01/25/2016   Lab Results  Component Value Date   TSH 1.740 01/25/2016   No results found for: HGBA1C Lab Results  Component Value Date   WBC 6.3 10/14/2017   HGB 13.9 10/14/2017   HCT 41.5 10/14/2017   MCV 95.3 10/14/2017   PLT 238 10/14/2017   Lab Results  Component Value Date   ALT 16 10/14/2017   AST 25 10/14/2017   ALKPHOS 61 10/14/2017   BILITOT 0.3 10/14/2017     Review of Systems  Patient Active Problem List   Diagnosis Date Noted  . HNP (herniated nucleus pulposus), lumbar 11/27/2019  . Moderate single current episode of major depressive disorder ([place]) 11/01/2019  . Foot pain, right 11/01/2019  . Muscle cramps 11/01/2019  . S/P hip replacement, right 08/08/2017  . Constipation 07/26/2015  . Tobacco use disorder 07/26/2015  . Insomnia 07/26/2015  . Underweight 07/26/2015    Allergies  Allergen Reactions  . Codeine Other (See Comments)    Family history of codeine allergy with rash  . Morphine And Related     Family history of morphine  reaction with rash  . Morphine Nausea Only    Decreases BP    Past Surgical History:  Procedure Laterality Date  . COLONOSCOPY    . SIGMOIDOSCOPY    . TOTAL HIP ARTHROPLASTY Right 10/26/2017   Procedure: TOTAL HIP ARTHROPLASTY;  Surgeon: Dereck Leep, MD;  Location: [facility] ORS;  Service: Orthopedics;  Laterality: Right;    Social History   Tobacco Use  . Smoking status: Current Every Day Smoker    Packs/day: 0.10    Types: Cigarettes  . Smokeless tobacco: Never Used  . Tobacco comment: 2 - 3 cigerettes a day  Vaping Use  . Vaping Use: Never used  Substance Use Topics  . Alcohol use: Yes    Alcohol/week: 2.0 standard drinks    Types: 2 Cans of beer per week    Comment: occasional  . Drug use: No     Medication list has been reviewed and updated.  No outpatient medications have been marked as taking for the 06/22/20 encounter (Appointment) with Glean Hess, MD.    [facility] 2/9 Scores 04/24/2020 11/01/2019 12/02/2017 05/29/2015  PHQ - 2 Score 0 4 0 0  PHQ- 9 Score 9 9 0 -    GAD 7 : Generalized Anxiety Score 04/24/2020 11/01/2019  Nervous, Anxious, on Edge 0 1  Control/stop worrying 0 2  Worry too much - different  things 0 2  Trouble relaxing 0 1  Restless 0 3  Easily annoyed or irritable 0 0  Afraid - awful might happen 0 3  Total GAD 7 Score 0 12  Anxiety Difficulty Not difficult at all Very difficult    BP Readings from Last 3 Encounters:  05/05/20 (!) 135/94  04/24/20 128/78  12/03/19 (!) 134/101    Physical Exam  Wt Readings from Last 3 Encounters:  05/05/20 166 lb (75.3 kg)  04/24/20 167 lb (75.8 kg)  12/03/19 150 lb (68 kg)    There were no vitals taken for this visit.  Assessment and Plan:

## 2020-06-28 ENCOUNTER — Telehealth: Payer: Self-pay | Admitting: Gastroenterology

## 2020-06-28 ENCOUNTER — Ambulatory Visit: Payer: 59 | Admitting: Gastroenterology

## 2020-06-28 NOTE — Telephone Encounter (Signed)
I called patient to inform her of her 3:15p appt and No answer & No VM set. A letter will be mailed out to patient.

## 2020-11-02 ENCOUNTER — Other Ambulatory Visit: Payer: Self-pay | Admitting: Internal Medicine

## 2020-11-02 DIAGNOSIS — F5101 Primary insomnia: Secondary | ICD-10-CM

## 2020-11-28 ENCOUNTER — Other Ambulatory Visit: Payer: Self-pay | Admitting: Internal Medicine

## 2020-11-28 DIAGNOSIS — F5101 Primary insomnia: Secondary | ICD-10-CM

## 2021-01-01 ENCOUNTER — Other Ambulatory Visit: Payer: Self-pay | Admitting: Internal Medicine

## 2021-01-01 DIAGNOSIS — F5101 Primary insomnia: Secondary | ICD-10-CM

## 2021-01-01 NOTE — Telephone Encounter (Signed)
Requested medications are due for refill today yes  Requested medications are on the active medication list yes  Last refill 12/05/19  Last visit 03/2020  Future visit scheduled no  Notes to clinic Failed protocol due to no valid visit within 6  months, no upcoming visit scheduled.

## 2021-01-13 NOTE — Progress Notes (Signed)
Date:  01/14/2021   Name:  Yvette Barr   DOB:  12/23/1960   MRN:  [id number]   Chief Complaint: Annual Exam (Breast exam with pap) and Colon Cancer Screening (Pt wants a colonoscopy didn't get to go during covid )  Yvette Barr is a 60 y.o. female who presents today for her Complete Annual Exam. She feels well. She reports exercising gym 1-2 days a week. She reports she is sleeping poorly. Breast complaints none. She concerned about her weight and wants to try Plenity (tried samples from a friend)  Mammogram: none DEXA: none Pap smear: 12/2015 Neg with HPV+  Colonoscopy: none  Immunization History  Administered Date(s) Administered  . Influenza,inj,Quad PF,6+ Mos 10/27/2017  . Influenza-Unspecified 07/01/2015    Insomnia Primary symptoms: sleep disturbance.  The problem occurs nightly. The symptoms are aggravated by work stress, family issues and bed partner. Treatments tried: trazodone not effective.    Lab Results  Component Value Date   CREATININE 1.04 (H) 11/01/2019   BUN 30 (H) 11/01/2019   NA 139 11/01/2019   K 4.2 11/01/2019   CL 105 11/01/2019   CO2 22 11/01/2019   Lab Results  Component Value Date   CHOL 139 01/25/2016   HDL 61 01/25/2016   LDLCALC 69 01/25/2016   TRIG 44 01/25/2016   CHOLHDL 2.3 01/25/2016   Lab Results  Component Value Date   TSH 1.740 01/25/2016   No results found for: HGBA1C Lab Results  Component Value Date   WBC 6.3 10/14/2017   HGB 13.9 10/14/2017   HCT 41.5 10/14/2017   MCV 95.3 10/14/2017   PLT 238 10/14/2017   Lab Results  Component Value Date   ALT 16 10/14/2017   AST 25 10/14/2017   ALKPHOS 61 10/14/2017   BILITOT 0.3 10/14/2017     Review of Systems  Constitutional: Positive for fatigue. Negative for chills and fever.  HENT: Negative for congestion, hearing loss, tinnitus, trouble swallowing and voice change.   Eyes: Negative for visual disturbance.  Respiratory: Negative for cough, chest tightness,  shortness of breath and wheezing.   Cardiovascular: Negative for chest pain, palpitations and leg swelling.  Gastrointestinal: Positive for constipation. Negative for abdominal pain, blood in stool, diarrhea and vomiting.  Endocrine: Negative for polydipsia and polyuria.  Genitourinary: Negative for dysuria, frequency, genital sores, vaginal bleeding and vaginal discharge.  Musculoskeletal: Negative for arthralgias, gait problem and joint swelling.  Skin: Negative for color change and rash.  Neurological: Negative for dizziness, tremors, light-headedness and headaches.  Hematological: Negative for adenopathy. Does not bruise/bleed easily.  Psychiatric/Behavioral: Positive for sleep disturbance. Negative for dysphoric mood. The patient has insomnia. The patient is not nervous/anxious.     Patient Active Problem List   Diagnosis Date Noted  . HNP (herniated nucleus pulposus), lumbar 11/27/2019  . Moderate single current episode of major depressive disorder ([place]) 11/01/2019  . Foot pain, right 11/01/2019  . Muscle cramps 11/01/2019  . S/P hip replacement, right 08/08/2017  . Constipation 07/26/2015  . Tobacco use disorder 07/26/2015  . Insomnia 07/26/2015  . Underweight 07/26/2015    Allergies  Allergen Reactions  . Codeine Other (See Comments)    Family history of codeine allergy with rash  . Morphine And Related     Family history of morphine reaction with rash  . Morphine Nausea Only    Decreases BP    Past Surgical History:  Procedure Laterality Date  . COLONOSCOPY    . SIGMOIDOSCOPY    .  TOTAL HIP ARTHROPLASTY Right 10/26/2017   Procedure: TOTAL HIP ARTHROPLASTY;  Surgeon: Dereck Leep, MD;  Location: [facility] ORS;  Service: Orthopedics;  Laterality: Right;    Social History   Tobacco Use  . Smoking status: Current Every Day Smoker    Packs/day: 0.10    Types: Cigarettes  . Smokeless tobacco: Never Used  . Tobacco comment: 2 - 3 cigerettes a day  Vaping Use  .  Vaping Use: Never used  Substance Use Topics  . Alcohol use: Yes    Alcohol/week: 2.0 standard drinks    Types: 2 Cans of beer per week    Comment: occasional  . Drug use: No     Medication list has been reviewed and updated.  No outpatient medications have been marked as taking for the 01/14/21 encounter (Office Visit) with Glean Hess, MD.    [facility] 2/9 Scores 01/14/2021 04/24/2020 11/01/2019 12/02/2017  PHQ - 2 Score 1 0 4 0  PHQ- 9 Score 6 9 9  0    GAD 7 : Generalized Anxiety Score 01/14/2021 04/24/2020 11/01/2019  Nervous, Anxious, on Edge 1 0 1  Control/stop worrying 1 0 2  Worry too much - different things 1 0 2  Trouble relaxing 0 0 1  Restless 0 0 3  Easily annoyed or irritable 0 0 0  Afraid - awful might happen 0 0 3  Total GAD 7 Score 3 0 12  Anxiety Difficulty - Not difficult at all Very difficult    BP Readings from Last 3 Encounters:  01/14/21 112/80  05/05/20 (!) 135/94  04/24/20 128/78    Physical Exam Vitals and nursing note reviewed.  Constitutional:      General: She is not in acute distress.    Appearance: She is well-developed.  HENT:     Head: Normocephalic and atraumatic.     Right Ear: Tympanic membrane and ear canal normal.     Left Ear: Tympanic membrane and ear canal normal.     Nose:     Right Sinus: No maxillary sinus tenderness.     Left Sinus: No maxillary sinus tenderness.  Eyes:     General: No scleral icterus.       Right eye: No discharge.        Left eye: No discharge.     Conjunctiva/sclera: Conjunctivae normal.  Neck:     Thyroid: No thyromegaly.     Vascular: No carotid bruit.  Cardiovascular:     Rate and Rhythm: Normal rate and regular rhythm.     Pulses: Normal pulses.     Heart sounds: Normal heart sounds.  Pulmonary:     Effort: Pulmonary effort is normal. No respiratory distress.     Breath sounds: No wheezing.  Chest:  Breasts:     Right: No mass, nipple discharge, skin change or tenderness.     Left: No mass,  nipple discharge, skin change or tenderness.    Abdominal:     General: Bowel sounds are normal.     Palpations: Abdomen is soft.     Tenderness: There is no abdominal tenderness.     Hernia: There is no hernia in the right inguinal area.  Genitourinary:    Labia:        Right: No tenderness, lesion or injury.        Left: No tenderness, lesion or injury.      Vagina: Normal.     Cervix: Normal.     Uterus: Normal.  Adnexa: Right adnexa normal and left adnexa normal.  Musculoskeletal:     Cervical back: Normal range of motion. No erythema.     Right lower leg: No edema.     Left lower leg: No edema.  Lymphadenopathy:     Cervical: No cervical adenopathy.  Skin:    General: Skin is warm and dry.     Capillary Refill: Capillary refill takes less than 2 seconds.     Findings: No rash.  Neurological:     Mental Status: She is alert and oriented to person, place, and time.     Cranial Nerves: No cranial nerve deficit.     Sensory: No sensory deficit.     Deep Tendon Reflexes: Reflexes are normal and symmetric.  Psychiatric:        Attention and Perception: Attention normal.        Mood and Affect: Mood normal.     Wt Readings from Last 3 Encounters:  01/14/21 161 lb (73 kg)  05/05/20 166 lb (75.3 kg)  04/24/20 167 lb (75.8 kg)    BP 112/80   Pulse 77   Temp 98 F (36.7 C) (Oral)   Ht 5\' 6"  (1.676 m)   Wt 161 lb (73 kg)   SpO2 97%   BMI 25.99 kg/m   Assessment and Plan: 1. Annual physical exam Continue exercise as able Rx for Plenity given - she had success with this already - CBC with Differential/Platelet - Comprehensive metabolic panel - Lipid panel - TSH  2. Encounter for screening for cervical cancer - Cytology - PAP  3. Encounter for screening mammogram for breast cancer Schedule at [facility] - MM 3D SCREEN BREAST BILATERAL; Future  4. Colon cancer screening - Ambulatory referral to Gastroenterology  5. Moderate single current episode of major  depressive disorder ([place]) Mild sx related to recent marriage; no SI/HI No medication for now  6. Primary insomnia Will try Sonata for sleep - zaleplon (SONATA) 10 MG capsule; Take 1 capsule (10 mg total) by mouth at bedtime as needed for sleep.  Dispense: 30 capsule; Refill: 1   Partially dictated using Editor, commissioning. Any errors are unintentional.  Halina Maidens, MD [facility]  01/14/2021

## 2021-01-14 ENCOUNTER — Other Ambulatory Visit (HOSPITAL_COMMUNITY)
Admission: RE | Admit: 2021-01-14 | Discharge: 2021-01-14 | Disposition: A | Discharge status: Home / Self Care | Payer: 59 | Source: Ambulatory Visit | Attending: Internal Medicine | Admitting: Internal Medicine

## 2021-01-14 ENCOUNTER — Other Ambulatory Visit: Payer: Self-pay

## 2021-01-14 ENCOUNTER — Encounter: Payer: Self-pay | Admitting: Internal Medicine

## 2021-01-14 ENCOUNTER — Ambulatory Visit (INDEPENDENT_AMBULATORY_CARE_PROVIDER_SITE_OTHER): Payer: 59 | Admitting: Internal Medicine

## 2021-01-14 VITALS — BP 112/80 | HR 77 | Temp 98.0°F | Ht 66.0 in | Wt 161.0 lb

## 2021-01-14 DIAGNOSIS — F5101 Primary insomnia: Secondary | ICD-10-CM

## 2021-01-14 DIAGNOSIS — Z1231 Encounter for screening mammogram for malignant neoplasm of breast: Secondary | ICD-10-CM

## 2021-01-14 DIAGNOSIS — Z0000 Encounter for general adult medical examination without abnormal findings: Secondary | ICD-10-CM

## 2021-01-14 DIAGNOSIS — F321 Major depressive disorder, single episode, moderate: Secondary | ICD-10-CM

## 2021-01-14 DIAGNOSIS — Z124 Encounter for screening for malignant neoplasm of cervix: Secondary | ICD-10-CM

## 2021-01-14 DIAGNOSIS — Z1211 Encounter for screening for malignant neoplasm of colon: Secondary | ICD-10-CM

## 2021-01-14 MED ORDER — ZALEPLON 10 MG PO CAPS: 10.0000 mg | ORAL_CAPSULE | Freq: Every evening | ORAL | 1 refills | Status: DC | PRN

## 2021-01-14 MED ORDER — PLENITY PO CAPS: 3.0000 | ORAL_CAPSULE | Freq: Two times a day (BID) | ORAL | 1 refills | Status: DC

## 2021-01-15 LAB — COMPREHENSIVE METABOLIC PANEL
ALT: 18 IU/L (ref 0–32)
AST: 19 IU/L (ref 0–40)
Albumin/Globulin Ratio: 1.8 (ref 1.2–2.2)
Albumin: 4.4 g/dL (ref 3.8–4.9)
Alkaline Phosphatase: 62 IU/L (ref 44–121)
BUN/Creatinine Ratio: 26 — ABNORMAL HIGH (ref 9–23)
BUN: 23 mg/dL (ref 6–24)
Bilirubin Total: <0.2 mg/dL (ref 0.0–1.2)
CO2: 22 mmol/L (ref 20–29)
Calcium: 9.1 mg/dL (ref 8.7–10.2)
Chloride: 106 mmol/L (ref 96–106)
Creatinine, Ser: 0.9 mg/dL (ref 0.57–1.00)
Globulin, Total: 2.5 g/dL (ref 1.5–4.5)
Glucose: 80 mg/dL (ref 65–99)
Potassium: 5 mmol/L (ref 3.5–5.2)
Sodium: 142 mmol/L (ref 134–144)
Total Protein: 6.9 g/dL (ref 6.0–8.5)
eGFR: 75 mL/min/{1.73_m2} (ref >59)

## 2021-01-15 LAB — CBC WITH DIFFERENTIAL/PLATELET
Basophils Absolute: 0.1 10*3/uL (ref 0.0–0.2)
Basos: 1 %
EOS (ABSOLUTE): 0.3 10*3/uL (ref 0.0–0.4)
Eos: 4 %
Hematocrit: 41.1 % (ref 34.0–46.6)
Hemoglobin: 14.1 g/dL (ref 11.1–15.9)
Immature Grans (Abs): 0 10*3/uL (ref 0.0–0.1)
Immature Granulocytes: 0 %
Lymphocytes Absolute: 2.5 10*3/uL (ref 0.7–3.1)
Lymphs: 32 %
MCH: 32.6 pg (ref 26.6–33.0)
MCHC: 34.3 g/dL (ref 31.5–35.7)
MCV: 95 fL (ref 79–97)
Monocytes Absolute: 0.6 10*3/uL (ref 0.1–0.9)
Monocytes: 8 %
Neutrophils Absolute: 4.2 10*3/uL (ref 1.4–7.0)
Neutrophils: 55 %
Platelets: 230 10*3/uL (ref 150–450)
RBC: 4.32 x10E6/uL (ref 3.77–5.28)
RDW: 12.7 % (ref 11.7–15.4)
WBC: 7.8 10*3/uL (ref 3.4–10.8)

## 2021-01-15 LAB — TSH: TSH: 2.08 u[IU]/mL (ref 0.450–4.500)

## 2021-01-15 LAB — LIPID PANEL
Chol/HDL Ratio: 2.6 ratio (ref 0.0–4.4)
Cholesterol, Total: 171 mg/dL (ref 100–199)
HDL: 67 mg/dL (ref >39)
LDL Chol Calc (NIH): 91 mg/dL (ref 0–99)
Triglycerides: 67 mg/dL (ref 0–149)
VLDL Cholesterol Cal: 13 mg/dL (ref 5–40)

## 2021-01-15 LAB — CYTOLOGY - PAP
Comment: NEGATIVE
Diagnosis: NEGATIVE
High risk HPV: NEGATIVE

## 2021-01-16 ENCOUNTER — Telehealth: Payer: Self-pay | Admitting: Internal Medicine

## 2021-01-16 NOTE — Telephone Encounter (Signed)
Pt calling regarding medication Zaleplon. She states that her insurance will not cover but 15 of these capsules per month without a PA. Please advise.

## 2021-01-18 ENCOUNTER — Telehealth: Payer: Self-pay

## 2021-01-18 NOTE — Telephone Encounter (Signed)
Called pt left VM with PA information. Completed PA It was approved for 30 pills per 30 days. Next refill date is 02/07/2021. Pts name is stated on VM.  KP

## 2021-01-18 NOTE — Telephone Encounter (Signed)
Called [facility] PA was approved for 30 per 30 days. Next refill date is 02/07/21.   Approved from 01/18/2021 to 01/18/2022  Reference number-[id number]  KP

## 2021-01-29 ENCOUNTER — Encounter: Payer: Self-pay | Admitting: *Deleted

## 2021-02-05 ENCOUNTER — Ambulatory Visit
Admission: RE | Admit: 2021-02-05 | Discharge: 2021-02-05 | Disposition: A | Discharge status: Home / Self Care | Payer: 59 | Source: Ambulatory Visit | Attending: Internal Medicine | Admitting: Internal Medicine

## 2021-02-05 ENCOUNTER — Other Ambulatory Visit: Payer: Self-pay

## 2021-02-05 DIAGNOSIS — Z1231 Encounter for screening mammogram for malignant neoplasm of breast: Secondary | ICD-10-CM | POA: Diagnosis present

## 2021-02-06 ENCOUNTER — Other Ambulatory Visit: Payer: Self-pay | Admitting: Internal Medicine

## 2021-02-06 DIAGNOSIS — N6489 Other specified disorders of breast: Secondary | ICD-10-CM

## 2021-02-06 DIAGNOSIS — R928 Other abnormal and inconclusive findings on diagnostic imaging of breast: Secondary | ICD-10-CM

## 2021-02-11 ENCOUNTER — Other Ambulatory Visit: Payer: Self-pay

## 2021-02-11 ENCOUNTER — Ambulatory Visit
Admission: RE | Admit: 2021-02-11 | Discharge: 2021-02-11 | Disposition: A | Discharge status: Home / Self Care | Payer: 59 | Source: Ambulatory Visit | Attending: Internal Medicine | Admitting: Internal Medicine

## 2021-02-11 DIAGNOSIS — N6489 Other specified disorders of breast: Secondary | ICD-10-CM | POA: Diagnosis present

## 2021-02-11 DIAGNOSIS — R928 Other abnormal and inconclusive findings on diagnostic imaging of breast: Secondary | ICD-10-CM | POA: Insufficient documentation

## 2021-03-07 ENCOUNTER — Other Ambulatory Visit: Payer: Self-pay

## 2021-03-07 ENCOUNTER — Ambulatory Visit: Payer: 59 | Admitting: Gastroenterology

## 2021-03-07 ENCOUNTER — Encounter: Payer: Self-pay | Admitting: Gastroenterology

## 2021-03-07 VITALS — BP 116/72 | HR 69 | Temp 97.2°F | Ht 66.0 in | Wt 162.6 lb

## 2021-03-07 DIAGNOSIS — R140 Abdominal distension (gaseous): Secondary | ICD-10-CM | POA: Diagnosis not present

## 2021-03-07 DIAGNOSIS — K5900 Constipation, unspecified: Secondary | ICD-10-CM

## 2021-03-07 MED ORDER — TRULANCE 3 MG PO TABS: 1.0000 | ORAL_TABLET | Freq: Every day | ORAL | 0 refills | Status: DC

## 2021-03-07 NOTE — Progress Notes (Signed)
Gastroenterology Consultation  Referring Provider:     Glean Hess, MD Primary Care Physician:  Glean Hess, MD Primary Gastroenterologist:  Dr. Allen Norris     Reason for Consultation:     Bloating and abdominal pain        HPI:   Yvette Barr is a 60 y.o. y/o female referred for consultation & management of Bloating and abdominal pain by Dr. Army Melia, Jesse Sans, MD.  This patient comes in today with a history of bloating and abdominal pain and states that she is seeing me for diverticulitis.  The patient I believe is referring to having diverticulosis.  She was sent by her primary care provider for screening colonoscopy.  The patient does report that she has abdominal bloating and abdominal pain with even drinking water.  She states that she tries to drink a lot of water and has a lot of increased bowel sounds.  She does have a past medical history of ischemic colitis.  She states that her constipation is quite bothersome with her having only a small amount of results whenever she takes something.  She states that she was on Linzess which gave her a lot more bloating and abdominal discomfort so she stopped it.  There is no report of any rectal bleeding or black stools.  She also denies any nausea or vomiting.  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 Authorizing Provider  Carboxymeth-Cellulose-CitricAc (PLENITY) CAPS Take 3 capsules by mouth 2 (two) times daily before lunch and supper. Patient not taking: Reported on 03/07/2021 01/14/21   Glean Hess, MD  Plecanatide (TRULANCE) 3 MG TABS Take 1 tablet by mouth daily.  03/07/21   Lucilla Lame, MD  zaleplon (SONATA) 10 MG capsule Take 1 capsule (10 mg total) by mouth at bedtime as needed for sleep. Patient not taking: Reported on 03/07/2021 01/14/21   Glean Hess, MD    Family History  Problem Relation Age of Onset   Hyperlipidemia Mother    Hyperlipidemia Father    Heart disease Father    Breast cancer Maternal Grandmother      Social History   Tobacco Use   Smoking status: Every Day    Packs/day: 0.10    Pack years: 0.00    Types: Cigarettes   Smokeless tobacco: Never   Tobacco comments:    2 - 3 cigerettes a day  Vaping Use   Vaping Use: Never used  Substance Use Topics   Alcohol use: Yes    Alcohol/week: 2.0 standard drinks    Types: 2 Cans of beer per week    Comment: occasional   Drug use: No    Allergies as of 03/07/2021 - Review Complete 03/07/2021  Allergen Reaction Noted   Codeine Other (See Comments) 05/29/2015   Morphine and related  05/29/2015   Morphine Nausea Only 11/07/2014    Review of Systems:    All systems reviewed and negative except where noted in HPI.   Physical Exam:  BP 116/72   Pulse 69   Temp (!) 97.2 F (36.2 C) (Temporal)  Ht 5\' 6"  (1.676 m)   Wt 162 lb 9.6 oz (73.8 kg)   BMI 26.24 kg/m  No LMP recorded. Patient is postmenopausal. General:   Alert,  Well-developed, well-nourished, pleasant and cooperative in NAD Head:  Normocephalic and atraumatic. Eyes:  Sclera clear, no icterus.   Conjunctiva pink. Ears:  Normal auditory acuity. Neck:  Supple; no masses or thyromegaly. Lungs:  Respirations even and unlabored.  Clear throughout to auscultation.   No wheezes, crackles, or rhonchi. No acute distress. Heart:  Regular rate and rhythm; no murmurs, clicks, rubs, or gallops. Abdomen:  Normal bowel sounds.  No bruits.  Soft, non-tender and non-distended without masses, hepatosplenomegaly or hernias noted.  No guarding or rebound tenderness.  Negative Carnett sign.   Rectal:  Deferred.  Pulses:   Normal pulses noted. Extremities:  No clubbing or edema.  No cyanosis. Neurologic:  Alert and oriented x3;  grossly normal neurologically. Skin:  Intact without significant lesions or rashes.  No jaundice. Lymph Nodes:  No significant cervical adenopathy. Psych:  Alert and cooperative. Normal mood and affect.  Imaging Studies: MM DIAG BREAST TOMO UNI LEFT  Result Date: 02/11/2021 CLINICAL DATA:  The patient was called back for 2 left breast asymmetries. EXAM: DIGITAL DIAGNOSTIC UNILATERAL LEFT MAMMOGRAM WITH TOMOSYNTHESIS AND CAD TECHNIQUE: Left digital diagnostic mammography and breast tomosynthesis was performed. The images were evaluated with computer-aided detection. COMPARISON:  Previous exam(s). ACR Breast Density Category b: There are scattered areas of fibroglandular density. FINDINGS: The left breast asymmetries resolve on today's imaging. IMPRESSION: No mammographic evidence of malignancy. RECOMMENDATION: Annual screening mammography. I have discussed the findings and recommendations with the patient. If applicable, a reminder letter will be sent to the patient regarding the next appointment. BI-RADS CATEGORY  1: Negative. Electronically Signed   By: Dorise Bullion III M.D   On: 02/11/2021 15:34    Assessment and Plan:   Yvette Barr is a 60 y.o. y/o female Who comes in today with a history of constipation that she states is gotten worse.  The patient also has a history of ischemic colitis and now she reports having abdominal pain in the epigastric area and mid abdominal area with excessive bloating and gas.  The patient was referred for a screening colonoscopy. The patient is unable to tell me when her last colonoscopy was and she denies ever having an upper endoscopy.  The patient will be set up for screening colonoscopy and an upper endoscopy to evaluate the source of her abdominal pain.  The patient has been explained the plan and agrees with it.    Lucilla Lame, MD. Marval Regal    Note:  This dictation was prepared with Dragon dictation along with smaller phrase technology. Any transcriptional errors that result from this process are unintentional.

## 2021-03-08 ENCOUNTER — Other Ambulatory Visit: Payer: Self-pay

## 2021-03-08 DIAGNOSIS — R1084 Generalized abdominal pain: Secondary | ICD-10-CM

## 2021-03-08 DIAGNOSIS — R140 Abdominal distension (gaseous): Secondary | ICD-10-CM

## 2021-03-08 DIAGNOSIS — Z1211 Encounter for screening for malignant neoplasm of colon: Secondary | ICD-10-CM

## 2021-03-08 MED ORDER — CLENPIQ 10-3.5-12 MG-GM -GM/160ML PO SOLN: 320.0000 mL | ORAL | 0 refills | Status: DC

## 2021-03-11 ENCOUNTER — Encounter: Payer: Self-pay | Admitting: Gastroenterology

## 2021-03-18 ENCOUNTER — Other Ambulatory Visit: Payer: Self-pay

## 2021-03-18 ENCOUNTER — Ambulatory Visit
Admission: RE | Admit: 2021-03-18 | Discharge: 2021-03-18 | Disposition: A | Discharge status: Home / Self Care | Payer: 59 | Attending: Gastroenterology | Admitting: Gastroenterology

## 2021-03-18 ENCOUNTER — Ambulatory Visit: Payer: 59 | Admitting: Anesthesiology

## 2021-03-18 ENCOUNTER — Encounter
Admission: RE | Disposition: A | Discharge status: Home / Self Care | Payer: Self-pay | Source: Home / Self Care | Attending: Gastroenterology

## 2021-03-18 ENCOUNTER — Encounter: Payer: Self-pay | Admitting: Gastroenterology

## 2021-03-18 DIAGNOSIS — K641 Second degree hemorrhoids: Secondary | ICD-10-CM | POA: Diagnosis not present

## 2021-03-18 DIAGNOSIS — Z8719 Personal history of other diseases of the digestive system: Secondary | ICD-10-CM

## 2021-03-18 DIAGNOSIS — Z96641 Presence of right artificial hip joint: Secondary | ICD-10-CM | POA: Diagnosis not present

## 2021-03-18 DIAGNOSIS — D126 Benign neoplasm of colon, unspecified: Secondary | ICD-10-CM

## 2021-03-18 DIAGNOSIS — D122 Benign neoplasm of ascending colon: Secondary | ICD-10-CM | POA: Insufficient documentation

## 2021-03-18 DIAGNOSIS — K635 Polyp of colon: Secondary | ICD-10-CM

## 2021-03-18 DIAGNOSIS — Z885 Allergy status to narcotic agent status: Secondary | ICD-10-CM | POA: Insufficient documentation

## 2021-03-18 DIAGNOSIS — K2210 Ulcer of esophagus without bleeding: Secondary | ICD-10-CM | POA: Insufficient documentation

## 2021-03-18 DIAGNOSIS — K2970 Gastritis, unspecified, without bleeding: Secondary | ICD-10-CM | POA: Insufficient documentation

## 2021-03-18 DIAGNOSIS — R1084 Generalized abdominal pain: Secondary | ICD-10-CM | POA: Diagnosis present

## 2021-03-18 DIAGNOSIS — R140 Abdominal distension (gaseous): Secondary | ICD-10-CM | POA: Diagnosis not present

## 2021-03-18 DIAGNOSIS — F17210 Nicotine dependence, cigarettes, uncomplicated: Secondary | ICD-10-CM | POA: Insufficient documentation

## 2021-03-18 DIAGNOSIS — Z1211 Encounter for screening for malignant neoplasm of colon: Secondary | ICD-10-CM

## 2021-03-18 HISTORY — PX: POLYPECTOMY: SHX5525

## 2021-03-18 HISTORY — PX: COLONOSCOPY WITH PROPOFOL: SHX5780

## 2021-03-18 HISTORY — PX: ESOPHAGOGASTRODUODENOSCOPY (EGD) WITH PROPOFOL: SHX5813

## 2021-03-18 SURGERY — COLONOSCOPY WITH PROPOFOL
Anesthesia: General | Site: Rectum

## 2021-03-18 MED ORDER — PROPOFOL 10 MG/ML IV BOLUS
INTRAVENOUS | Status: DC | PRN
  Administered 2021-03-18: 50 mg via INTRAVENOUS
  Administered 2021-03-18: 20 mg via INTRAVENOUS
  Administered 2021-03-18 (×6): 30 mg via INTRAVENOUS

## 2021-03-18 MED ORDER — GLYCOPYRROLATE 0.2 MG/ML IJ SOLN
INTRAMUSCULAR | Status: DC | PRN
  Administered 2021-03-18: .1 mg via INTRAVENOUS

## 2021-03-18 MED ORDER — LIDOCAINE HCL (CARDIAC) PF 100 MG/5ML IV SOSY
PREFILLED_SYRINGE | INTRAVENOUS | Status: DC | PRN
  Administered 2021-03-18: 60 mg via INTRAVENOUS

## 2021-03-18 MED ORDER — STERILE WATER FOR IRRIGATION IR SOLN
Status: DC | PRN
  Administered 2021-03-18: 150 mL

## 2021-03-18 MED ORDER — PANTOPRAZOLE SODIUM 40 MG PO TBEC: 40.0000 mg | DELAYED_RELEASE_TABLET | Freq: Every day | ORAL | 1 refills | Status: DC

## 2021-03-18 MED ORDER — LACTATED RINGERS IV SOLN: INTRAVENOUS | Status: DC

## 2021-03-18 MED ORDER — SODIUM CHLORIDE 0.9 % IV SOLN: INTRAVENOUS | Status: DC

## 2021-03-18 SURGICAL SUPPLY — 38 items
BALLN DILATOR 10-12 8 (BALLOONS)
BALLN DILATOR 12-15 8 (BALLOONS)
BALLN DILATOR 15-18 8 (BALLOONS)
BALLN DILATOR CRE 0-12 8 (BALLOONS)
BALLN DILATOR ESOPH 8 10 CRE (MISCELLANEOUS) IMPLANT
BALLOON DILATOR 12-15 8 (BALLOONS) IMPLANT
BALLOON DILATOR 15-18 8 (BALLOONS) IMPLANT
BALLOON DILATOR CRE 0-12 8 (BALLOONS) IMPLANT
BLOCK BITE 60FR ADLT L/F GRN (MISCELLANEOUS) ×3 IMPLANT
CLIP HMST 235XBRD CATH ROT (MISCELLANEOUS) IMPLANT
CLIP RESOLUTION 360 11X235 (MISCELLANEOUS)
ELECT REM PT RETURN 9FT ADLT (ELECTROSURGICAL)
ELECTRODE REM PT RTRN 9FT ADLT (ELECTROSURGICAL) IMPLANT
FCP ESCP3.2XJMB 240X2.8X (MISCELLANEOUS)
FORCEPS BIOP RAD 4 LRG CAP 4 (CUTTING FORCEPS) ×3 IMPLANT
FORCEPS BIOP RJ4 240 W/NDL (MISCELLANEOUS)
FORCEPS ESCP3.2XJMB 240X2.8X (MISCELLANEOUS) IMPLANT
GOWN CVR UNV OPN BCK APRN NK (MISCELLANEOUS) ×8 IMPLANT
GOWN ISOL THUMB LOOP REG UNIV (MISCELLANEOUS) ×12
INJECTOR VARIJECT VIN23 (MISCELLANEOUS) IMPLANT
KIT DEFENDO VALVE AND CONN (KITS) IMPLANT
KIT PRC NS LF DISP ENDO (KITS) ×4 IMPLANT
KIT PROCEDURE OLYMPUS (KITS) ×6
MANIFOLD NEPTUNE II (INSTRUMENTS) ×6 IMPLANT
MARKER SPOT ENDO TATTOO 5ML (MISCELLANEOUS) IMPLANT
PROBE APC STR FIRE (PROBE) IMPLANT
RETRIEVER NET PLAT FOOD (MISCELLANEOUS) IMPLANT
RETRIEVER NET ROTH 2.5X230 LF (MISCELLANEOUS) IMPLANT
SNARE COLD EXACTO (MISCELLANEOUS) ×3 IMPLANT
SNARE SHORT THROW 13M SML OVAL (MISCELLANEOUS) IMPLANT
SNARE SHORT THROW 30M LRG OVAL (MISCELLANEOUS) IMPLANT
SNARE SNG USE RND 15MM (INSTRUMENTS) IMPLANT
SPOT EX ENDOSCOPIC TATTOO (MISCELLANEOUS)
SYR INFLATION 60ML (SYRINGE) IMPLANT
TRAP ETRAP POLY (MISCELLANEOUS) ×3 IMPLANT
VARIJECT INJECTOR VIN23 (MISCELLANEOUS)
WATER STERILE IRR 250ML POUR (IV SOLUTION) ×6 IMPLANT
WIRE CRE 18-20MM 8CM F G (MISCELLANEOUS) IMPLANT

## 2021-03-18 NOTE — Op Note (Signed)
[facility] Patient Name: Yvette Barr Procedure Date: 03/18/2021 10:36 AM MRN: [id number] Account #: [id number] Date of Birth: [birth year] Admit Type: Outpatient Age: 60 Room: [facility] OR ROOM 1 Gender: Female Note Status: Finalized Procedure:             Upper GI endoscopy Indications:           Generalized abdominal pain Providers:             Lucilla Lame MD, MD Referring MD:          Halina Maidens, MD (Referring MD) Medicines:             Propofol per Anesthesia Complications:         No immediate complications. Procedure:             Pre-Anesthesia Assessment:                        - Prior to the procedure, a History and Physical was                         performed, and patient medications and allergies were                         reviewed. The patient's tolerance of previous                         anesthesia was also reviewed. The risks and benefits                         of the procedure and the sedation options and risks                         were discussed with the patient. All questions were                         answered, and informed consent was obtained. Prior                         Anticoagulants: The patient has taken no previous                         anticoagulant or antiplatelet agents. ASA Grade                         Assessment: II - A patient with mild systemic disease.                         After reviewing the risks and benefits, the patient                         was deemed in satisfactory condition to undergo the                         procedure.                        After obtaining informed consent, the endoscope was  passed under direct vision. Throughout the procedure,                         the patient's blood pressure, pulse, and oxygen                         saturations were monitored continuously. The was                         introduced through the mouth, and advanced to the                          second part of duodenum. The upper GI endoscopy was                         accomplished without difficulty. The patient tolerated                         the procedure well. Findings:      One linear esophageal ulcer with no stigmata of recent bleeding was       found at the gastroesophageal junction.      Patchy mild inflammation characterized by congestion (edema) was found       in the gastric antrum. Biopsies were taken with a cold forceps for       histology.      The examined duodenum was normal. Impression:            - Esophageal ulcer with no stigmata of recent bleeding.                        - Gastritis. Biopsied.                        - Normal examined duodenum. Recommendation:        - Discharge patient to home.                        - Resume previous diet.                        - Continue present medications.                        - Await pathology results.                        - Perform a colonoscopy today.                        - Use a proton pump inhibitor PO daily. Procedure Code(s):     --- Professional ---                        [phone], Esophagogastroduodenoscopy, flexible,                         transoral; with biopsy, single or multiple Diagnosis Code(s):     --- Professional ---                        R10.84, Generalized abdominal pain  K22.10, Ulcer of esophagus without bleeding CPT copyright 2019 American Medical Association. All rights reserved. The codes documented in this report are preliminary and upon coder review may  be revised to meet current compliance requirements. Lucilla Lame MD, MD 03/18/2021 10:52:56 AM This report has been signed electronically. Number of Addenda: 0 Note Initiated On: 03/18/2021 10:36 AM Total Procedure Duration: 0 hours 2 minutes 26 seconds  Estimated Blood Loss:  Estimated blood loss: none.      [facility]

## 2021-03-18 NOTE — Anesthesia Preprocedure Evaluation (Signed)
Anesthesia Evaluation  Patient identified by MRN, date of birth, ID band Patient awake    Reviewed: Allergy & Precautions, NPO status , Patient's Chart, lab work & pertinent test results  Airway Mallampati: II  TM Distance: >3 FB Neck ROM: Full    Dental no notable dental hx.    Pulmonary Current Smoker and Patient abstained from smoking.,    Pulmonary exam normal        Cardiovascular negative cardio ROS Normal cardiovascular exam     Neuro/Psych Depression negative neurological ROS     GI/Hepatic Neg liver ROS, abdominal pain, bloating   Endo/Other  negative endocrine ROS  Renal/GU negative Renal ROS     Musculoskeletal  (+) Arthritis , Osteoarthritis,    Abdominal Normal abdominal exam  (+)   Peds  Hematology negative hematology ROS (+)   Anesthesia Other Findings   Reproductive/Obstetrics                             Anesthesia Physical Anesthesia Plan  ASA: 2  Anesthesia Plan: General   Post-op Pain Management:    Induction: Intravenous  PONV Risk Score and Plan: 2 and Propofol infusion, TIVA and Treatment may vary due to age or medical condition  Airway Management Planned: Natural Airway and Nasal Cannula  Additional Equipment:   Intra-op Plan:   Post-operative Plan:   Informed Consent: I have reviewed the patients History and Physical, chart, labs and discussed the procedure including the risks, benefits and alternatives for the proposed anesthesia with the patient or authorized representative who has indicated his/her understanding and acceptance.     Dental advisory given  Plan Discussed with: CRNA  Anesthesia Plan Comments:         Anesthesia Quick Evaluation

## 2021-03-18 NOTE — Anesthesia Procedure Notes (Signed)
Date/Time: 03/18/2021 10:44 AM Performed by: Dionne Bucy, CRNA Pre-anesthesia Checklist: Patient identified, Emergency Drugs available, Suction available, Patient being monitored and Timeout performed Patient Re-evaluated:Patient Re-evaluated prior to induction Oxygen Delivery Method: Nasal cannula Induction Type: IV induction Placement Confirmation: positive ETCO2

## 2021-03-18 NOTE — H&P (Signed)
Yvette Lame, MD [facility] [address], [place],  [address] Phone:[phone] Fax : [phone]  Primary Care Physician:  Glean Hess, MD Primary Gastroenterologist:  Dr. Allen Norris  Pre-Procedure History & Physical: HPI:  Yvette Barr is a 60 y.o. female is here for an endoscopy and colonoscopy.   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 Authorizing Provider  Sod Picosulfate-Mag Ox-Cit Acd (CLENPIQ) 10-3.5-12 MG-GM -GM/160ML SOLN Take 320 mLs by mouth as directed. 03/08/21   Yvette Lame, MD  Carboxymeth-Cellulose-CitricAc (PLENITY) CAPS Take 3 capsules by mouth 2 (two) times daily before lunch and supper. Patient not taking: No sig reported 01/14/21   Glean Hess, MD  Plecanatide (TRULANCE) 3 MG TABS Take 1 tablet by mouth daily. Patient not taking: Reported on 03/11/2021 03/07/21   Yvette Lame, MD  zaleplon (SONATA) 10 MG capsule Take 1 capsule (10 mg total) by mouth at bedtime as needed for sleep. Patient not taking: No sig reported 01/14/21   Glean Hess, MD    Allergies as of 03/08/2021 - Review Complete 03/07/2021  Allergen Reaction Noted   Codeine Other (See Comments) 05/29/2015   Morphine and related  05/29/2015   Morphine Nausea Only 11/07/2014    Family History  Problem Relation Age of Onset   Hyperlipidemia Mother    Hyperlipidemia Father    Heart disease Father    Breast cancer Maternal Grandmother     Social History   Socioeconomic History   Marital status: Widowed    Spouse name: Not on file   Number of children: Not on file   Years of education: Not on file    Highest education level: Not on file  Occupational History   Not on file  Tobacco Use   Smoking status: Every Day    Packs/day: 0.50    Years: 25.00    Pack years: 12.50    Types: Cigarettes   Smokeless tobacco: Never   Tobacco comments:       Vaping Use   Vaping Use: Never used  Substance and Sexual Activity   Alcohol use: Yes    Alcohol/week: 2.0 standard drinks    Types: 2 Cans of beer per week    Comment: occasional   Drug use: No   Sexual activity: Yes  Other Topics Concern   Not on file  Social History Narrative   Not on file   Social Determinants of Health   Financial Resource Strain: Not on file  Food Insecurity: Not on file  Transportation Needs: Not on file  Physical Activity: Not on file  Stress: Not on file  Social Connections: Not on file  Intimate Partner Violence: Not on file    Review of Systems: See HPI, otherwise negative ROS  Physical Exam: Ht 5\' 6"  (1.676 m)   Wt 73.5 kg   BMI 26.15 kg/m  General:   Alert,  pleasant and cooperative in NAD Head:  Normocephalic and atraumatic. Neck:  Supple; no masses or thyromegaly. Lungs:  Clear throughout to auscultation.    Heart:  Regular rate and rhythm. Abdomen:  Soft, nontender and nondistended. Normal bowel sounds, without guarding, and without rebound.   Neurologic:  Alert and  oriented x4;  grossly normal neurologically.  Impression/Plan: Yvette Barr is here for an endoscopy and colonoscopy to be performed for bloating and abdominal pain.  Risks, benefits, limitations, and alternatives regarding  endoscopy and colonoscopy have been reviewed with the patient.  Questions have been answered.  All parties agreeable.   Yvette Lame, MD  03/18/2021, 10:16 AM

## 2021-03-18 NOTE — Op Note (Signed)
[facility] Patient Name: Yvette Barr Procedure Date: 03/18/2021 10:36 AM MRN: [id number] Account #: [id number] Date of Birth: [birth year] Admit Type: Outpatient Age: 60 Room: [facility] OR ROOM 01 Gender: Female Note Status: Finalized Procedure:             Colonoscopy Providers:             Lucilla Lame MD, MD Referring MD:          Halina Maidens, MD (Referring MD) Medicines:             Propofol per Anesthesia Complications:         No immediate complications. Procedure:             Pre-Anesthesia Assessment:                        - Prior to the procedure, a History and Physical was                         performed, and patient medications and allergies were                         reviewed. The patient's tolerance of previous                         anesthesia was also reviewed. The risks and benefits                         of the procedure and the sedation options and risks                         were discussed with the patient. All questions were                         answered, and informed consent was obtained. Prior                         Anticoagulants: The patient has taken no previous                         anticoagulant or antiplatelet agents. ASA Grade                         Assessment: II - A patient with mild systemic disease.                         After reviewing the risks and benefits, the patient                         was deemed in satisfactory condition to undergo the                         procedure.                        After obtaining informed consent, the colonoscope was                         passed under direct vision. Throughout the procedure,  the patient's blood pressure, pulse, and oxygen                         saturations were monitored continuously. The                         Colonoscope was introduced through the anus and                         advanced to the the cecum, identified  by appendiceal                         orifice and ileocecal valve. The colonoscopy was                         performed without difficulty. The patient tolerated                         the procedure well. The quality of the bowel                         preparation was excellent. Findings:      The perianal and digital rectal examinations were normal.      A 6 mm polyp was found in the ascending colon. The polyp was sessile.       The polyp was removed with a cold snare. Resection and retrieval were       complete.      Non-bleeding internal hemorrhoids were found during retroflexion. The       hemorrhoids were Grade II (internal hemorrhoids that prolapse but reduce       spontaneously). Impression:            - One 6 mm polyp in the ascending colon, removed with                         a cold snare. Resected and retrieved.                        - Non-bleeding internal hemorrhoids. Recommendation:        - Discharge patient to home.                        - Resume previous diet.                        - Continue present medications.                        - Await pathology results.                        - Repeat colonoscopy in 7 years for surveillance if                         adenomatous and 10 years if hyperplastic. Procedure Code(s):     --- Professional ---                        [phone], Colonoscopy, flexible; with removal of  tumor(s), polyp(s), or other lesion(s) by snare                         technique Diagnosis Code(s):     --- Professional ---                        K63.5, Polyp of colon CPT copyright 2019 American Medical Association. All rights reserved. The codes documented in this report are preliminary and upon coder review may  be revised to meet current compliance requirements. Lucilla Lame MD, MD 03/18/2021 11:10:40 AM This report has been signed electronically. Number of Addenda: 0 Note Initiated On: 03/18/2021 10:36 AM Scope Withdrawal  Time: 0 hours 11 minutes 25 seconds  Total Procedure Duration: 0 hours 13 minutes 50 seconds  Estimated Blood Loss:  Estimated blood loss: none.      [facility]

## 2021-03-18 NOTE — Transfer of Care (Signed)
Immediate Anesthesia Transfer of Care Note  Patient: Yvette Barr  Procedure(s) Performed: COLONOSCOPY WITH PROPOFOL POLYPECTOMY (Rectum) ESOPHAGOGASTRODUODENOSCOPY (EGD) WITH PROPOFOL  Patient Location: PACU  Anesthesia Type: General  Level of Consciousness: awake, alert  and patient cooperative  Airway and Oxygen Therapy: Patient Spontanous Breathing and Patient connected to supplemental oxygen  Post-op Assessment: Post-op Vital signs reviewed, Patient's Cardiovascular Status Stable, Respiratory Function Stable, Patent Airway and No signs of Nausea or vomiting  Post-op Vital Signs: Reviewed and stable  Complications: No notable events documented.

## 2021-03-18 NOTE — Anesthesia Postprocedure Evaluation (Signed)
Anesthesia Post Note  Patient: Yvette Barr  Procedure(s) Performed: COLONOSCOPY WITH PROPOFOL POLYPECTOMY (Rectum) ESOPHAGOGASTRODUODENOSCOPY (EGD) WITH PROPOFOL     Patient location during evaluation: PACU Anesthesia Type: General Level of consciousness: awake and alert Pain management: pain level controlled Vital Signs Assessment: post-procedure vital signs reviewed and stable Respiratory status: spontaneous breathing and nonlabored ventilation Cardiovascular status: blood pressure returned to baseline Postop Assessment: no apparent nausea or vomiting Anesthetic complications: no   No notable events documented.  Trig Mcbryar [employer]

## 2021-03-19 ENCOUNTER — Encounter: Payer: Self-pay | Admitting: Gastroenterology

## 2021-03-21 LAB — SURGICAL PATHOLOGY

## 2021-03-25 ENCOUNTER — Encounter: Payer: Self-pay | Admitting: Gastroenterology

## 2021-04-09 ENCOUNTER — Other Ambulatory Visit: Payer: Self-pay | Admitting: Gastroenterology

## 2021-05-03 ENCOUNTER — Other Ambulatory Visit: Payer: Self-pay

## 2021-05-03 ENCOUNTER — Encounter: Payer: Self-pay | Admitting: Emergency Medicine

## 2021-05-03 ENCOUNTER — Emergency Department: Payer: No Typology Code available for payment source

## 2021-05-03 DIAGNOSIS — Z23 Encounter for immunization: Secondary | ICD-10-CM | POA: Insufficient documentation

## 2021-05-03 DIAGNOSIS — W231XXA Caught, crushed, jammed, or pinched between stationary objects, initial encounter: Secondary | ICD-10-CM | POA: Diagnosis not present

## 2021-05-03 DIAGNOSIS — Z96641 Presence of right artificial hip joint: Secondary | ICD-10-CM | POA: Insufficient documentation

## 2021-05-03 DIAGNOSIS — S61313A Laceration without foreign body of left middle finger with damage to nail, initial encounter: Secondary | ICD-10-CM | POA: Diagnosis not present

## 2021-05-03 DIAGNOSIS — Y990 Civilian activity done for income or pay: Secondary | ICD-10-CM | POA: Diagnosis not present

## 2021-05-03 DIAGNOSIS — S6992XA Unspecified injury of left wrist, hand and finger(s), initial encounter: Secondary | ICD-10-CM | POA: Diagnosis present

## 2021-05-03 DIAGNOSIS — Y9289 Other specified places as the place of occurrence of the external cause: Secondary | ICD-10-CM | POA: Diagnosis not present

## 2021-05-03 DIAGNOSIS — Y9389 Activity, other specified: Secondary | ICD-10-CM | POA: Insufficient documentation

## 2021-05-03 DIAGNOSIS — F17210 Nicotine dependence, cigarettes, uncomplicated: Secondary | ICD-10-CM | POA: Diagnosis not present

## 2021-05-03 NOTE — ED Triage Notes (Signed)
Pt reports was at work and crushed her left hand middle finger in a machine. Bleeding controlled.

## 2021-05-04 ENCOUNTER — Emergency Department
Admission: EM | Admit: 2021-05-04 | Discharge: 2021-05-04 | Disposition: A | Discharge status: Home / Self Care | Payer: No Typology Code available for payment source | Attending: Emergency Medicine | Admitting: Emergency Medicine

## 2021-05-04 DIAGNOSIS — S61313A Laceration without foreign body of left middle finger with damage to nail, initial encounter: Secondary | ICD-10-CM

## 2021-05-04 MED ORDER — BUPIVACAINE HCL (PF) 0.5 % IJ SOLN
10.0000 mL | Freq: Once | INTRAMUSCULAR | Status: AC
  Administered 2021-05-04: 10 mL
  Filled 2021-05-04: qty 30

## 2021-05-04 MED ORDER — BACITRACIN ZINC 500 UNIT/GM EX OINT
TOPICAL_OINTMENT | Freq: Two times a day (BID) | CUTANEOUS | Status: DC
  Administered 2021-05-04: 1 via TOPICAL
  Filled 2021-05-04: qty 0.9

## 2021-05-04 MED ORDER — CEPHALEXIN 500 MG PO CAPS: 500.0000 mg | ORAL_CAPSULE | Freq: Three times a day (TID) | ORAL | 0 refills | Status: AC

## 2021-05-04 MED ORDER — TETANUS-DIPHTH-ACELL PERTUSSIS 5-2.5-18.5 LF-MCG/0.5 IM SUSY
0.5000 mL | PREFILLED_SYRINGE | Freq: Once | INTRAMUSCULAR | Status: AC
  Administered 2021-05-04: 0.5 mL via INTRAMUSCULAR
  Filled 2021-05-04: qty 0.5

## 2021-05-04 NOTE — ED Notes (Signed)
Pt updated on wait and encouraged to stay.

## 2021-05-04 NOTE — ED Provider Notes (Addendum)
[facility] Emergency Department Provider Note  ____________________________________________   Event Date/Time   First MD Initiated Contact with Patient 05/04/21 [phone]     (approximate)  I have reviewed the triage vital signs and the nursing notes.   HISTORY  Chief Complaint Laceration and Finger Injury    HPI Yvette Barr is a 60 y.o. female who with injury to her left middle finger.  The patient was at work today.  She was placing something on a machine when she accidentally got her left middle finger crushed in the machine.  She immediately removed it.  She reports immediate onset of sharp, stabbing, distal finger pain.  She had some moderate bleeding at the time.  She is on blood thinners.  She states she has pain with movement but otherwise denies any other complaints.  Her tetanus is up-to-date.  Denies difficulty with wound healing or recurrent infections.  She is not immunosuppressed.    Past Medical History:  Diagnosis Date   Ischemic colitis [facility])     Patient Active Problem List   Diagnosis Date Noted   Bloating    Polyp of ascending colon    Generalized abdominal pain    Gastritis without bleeding    HNP (herniated nucleus pulposus), lumbar 11/27/2019   Moderate single current episode of major depressive disorder ([place]) 11/01/2019   Foot pain, right 11/01/2019   Muscle cramps 11/01/2019   S/P hip replacement, right 08/08/2017   Constipation 07/26/2015   Tobacco use disorder 07/26/2015   Insomnia 07/26/2015   Underweight 07/26/2015    Past Surgical History:  Procedure Laterality Date   COLONOSCOPY     COLONOSCOPY WITH PROPOFOL N/A 03/18/2021   Procedure: COLONOSCOPY WITH PROPOFOL;  Surgeon: Lucilla Lame, MD;  Location: [place];  Service: Endoscopy;  Laterality: N/A;   ESOPHAGOGASTRODUODENOSCOPY (EGD) WITH PROPOFOL N/A 03/18/2021   Procedure: ESOPHAGOGASTRODUODENOSCOPY (EGD) WITH PROPOFOL;  Surgeon: Lucilla Lame, MD;   Location: [place];  Service: Endoscopy;  Laterality: N/A;   POLYPECTOMY N/A 03/18/2021   Procedure: POLYPECTOMY;  Surgeon: Lucilla Lame, MD;  Location: [place];  Service: Endoscopy;  Laterality: N/A;   SIGMOIDOSCOPY     TOTAL HIP ARTHROPLASTY Right 10/26/2017   Procedure: TOTAL HIP ARTHROPLASTY;  Surgeon: Dereck Leep, MD;  Location: [facility];  Service: Orthopedics;  Laterality: Right;    Prior to Admission medications   Medication Sig Start Date End Date Taking? Authorizing Provider  cephALEXin (KEFLEX) 500 MG capsule Take 1 capsule (500 mg total) by mouth 3 (three) times daily for 7 days. 05/04/21 05/11/21 Yes Duffy Bruce, MD  Carboxymeth-Cellulose-CitricAc (PLENITY) CAPS Take 3 capsules by mouth 2 (two) times daily before lunch and supper. Patient not taking: No sig reported 01/14/21   Glean Hess, MD  pantoprazole (PROTONIX) 40 MG tablet TAKE 1 TABLET BY MOUTH EVERY DAY 04/18/21   Lucilla Lame, MD  Plecanatide (TRULANCE) 3 MG TABS Take 1 tablet by mouth daily. 03/07/21   Lucilla Lame, MD  Sod Picosulfate-Mag Ox-Cit Acd (CLENPIQ) 10-3.5-12 MG-GM -GM/160ML SOLN Take 320 mLs by mouth as directed. 03/08/21   Lucilla Lame, MD  zaleplon (SONATA) 10 MG capsule Take 1 capsule (10 mg total) by mouth at bedtime as needed for sleep. Patient not taking: No sig reported 01/14/21   Glean Hess, MD    Allergies Codeine, Morphine and related, and Morphine  Family History  Problem Relation Age of Onset   Hyperlipidemia Mother    Hyperlipidemia Father  Heart disease Father    Breast cancer Maternal Grandmother     Social History Social History   Tobacco Use   Smoking status: Every Day    Packs/day: 0.50    Years: 25.00    Pack years: 12.50    Types: Cigarettes   Smokeless tobacco: Never   Tobacco comments:       Vaping Use   Vaping Use: Never used  Substance Use Topics   Alcohol use: Yes    Alcohol/week: 2.0 standard drinks    Types: 2 Cans of beer  per week    Comment: occasional   Drug use: No    Review of Systems  Review of Systems  Constitutional:  Negative for chills and fever.  HENT:  Negative for sore throat.   Respiratory:  Negative for shortness of breath.   Cardiovascular:  Negative for chest pain.  Gastrointestinal:  Negative for abdominal pain.  Genitourinary:  Negative for flank pain.  Musculoskeletal:  Positive for arthralgias. Negative for neck pain.  Skin:  Positive for wound. Negative for rash.  Allergic/Immunologic: Negative for immunocompromised state.  Neurological:  Negative for weakness and numbness.  Hematological:  Does not bruise/bleed easily.    ____________________________________________  PHYSICAL EXAM:      VITAL SIGNS: ED Triage Vitals  Enc Vitals Group     BP 05/03/21 1936 (!) 134/91     Pulse Rate 05/03/21 1936 78     Resp 05/03/21 1936 18     Temp 05/03/21 1936 98.3 F (36.8 C)     Temp Source 05/03/21 1936 Oral     SpO2 05/03/21 1936 97 %     Weight 05/03/21 1925 155 lb (70.3 kg)     Height 05/03/21 1925 '5\' 5"'$  (1.651 m)     Head Circumference --      Peak Flow --      Pain Score 05/03/21 1925 3     Pain Loc --      Pain Edu? --      Excl. in [place]? --      Physical Exam Vitals and nursing note reviewed.  Constitutional:      General: She is not in acute distress.    Appearance: She is well-developed.  HENT:     Head: Normocephalic and atraumatic.  Eyes:     Conjunctiva/sclera: Conjunctivae normal.  Cardiovascular:     Rate and Rhythm: Normal rate and regular rhythm.     Heart sounds: Normal heart sounds.  Pulmonary:     Effort: Pulmonary effort is normal. No respiratory distress.     Breath sounds: No wheezing.  Abdominal:     General: There is no distension.  Musculoskeletal:     Cervical back: Neck supple.  Skin:    General: Skin is warm.     Capillary Refill: Capillary refill takes less than 2 seconds.     Findings: No rash.  Neurological:     Mental Status:  She is alert and oriented to person, place, and time.     Motor: No abnormal muscle tone.     UPPER EXTREMITY EXAM: right  INSPECTION & PALPATION: Right middle finger with laceration/maceration of distal tip of finger, with exposed nail bed. Germinal matrix is intact.  SENSORY: Sensation is intact to light touch in:  Superficial radial nerve distribution (dorsal first web space) Median nerve distribution (tip of index finger)   Ulnar nerve distribution (tip of small finger)     MOTOR:  + Motor posterior  interosseous nerve (thumb IP extension) + Anterior interosseous nerve (thumb IP flexion, index finger DIP flexion) + Radial nerve (wrist extension) + Median nerve (palpable firing thenar mass) + Ulnar nerve (palpable firing of first dorsal interosseous muscle)  VASCULAR: 2+ radial pulse Brisk capillary refill < 2 sec, fingers warm and well-perfused  ____________________________________________   LABS (all labs ordered are listed, but only abnormal results are displayed)  Labs Reviewed - No data to display  ____________________________________________  EKG:  ________________________________________  RADIOLOGY All imaging, including plain films, CT scans, and ultrasounds, independently reviewed by me, and interpretations confirmed via formal radiology reads.  ED MD interpretation:   DG Middle Finger Left: Fx of third distal phalanx  Official radiology report(s): DG Finger Middle Left  Result Date: 05/03/2021 CLINICAL DATA:  Crush injury EXAM: LEFT MIDDLE FINGER 2+V COMPARISON:  None. FINDINGS: Acute mildly comminuted fracture involving the tuft of the third distal phalanx. No subluxation. No radiopaque foreign body IMPRESSION: Acute mildly comminuted fracture involving tuft of third distal phalanx Electronically Signed   By: Donavan Foil M.D.   On: 05/03/2021 21:45    ____________________________________________  PROCEDURES   Procedure(s) performed (including  Critical Care):  Marland KitchenMarland KitchenLaceration Repair  Date/Time: 05/04/2021 7:38 AM Performed by: Duffy Bruce, MD Authorized by: Duffy Bruce, MD   Consent:    Consent obtained:  Verbal   Consent given by:  Patient   Risks, benefits, and alternatives were discussed: yes     Risks discussed:  Infection, need for additional repair, nerve damage, pain, poor cosmetic result, poor wound healing, retained foreign body, tendon damage and vascular damage   Alternatives discussed:  Referral Anesthesia:    Anesthesia method:  Nerve block   Block location:  Digital nerve block   Block anesthetic:  Bupivacaine 0.5% w/o epi   Block injection procedure:  Anatomic landmarks identified, anatomic landmarks palpated, negative aspiration for blood, introduced needle and incremental injection   Block outcome:  Anesthesia achieved Laceration details:    Location:  Finger (Left middle digit)   Finger location:  L long finger   Length (cm):  1 Pre-procedure details:    Preparation:  Patient was prepped and draped in usual sterile fashion Exploration:    Hemostasis achieved with:  Direct pressure   Imaging obtained: bedside ultrasound     Imaging outcome: foreign body not noted     Contaminated: no   Treatment:    Area cleansed with:  Povidone-iodine   Amount of cleaning:  Extensive   Irrigation solution:  Sterile saline   Irrigation method:  Pressure wash   Debridement:  Minimal   Undermining:  None   Scar revision: no   Skin repair:    Repair method:  Sutures   Suture size:  4-0 and 5-0   Wound skin closure material used: Vicryl.   Suture technique:  Simple interrupted   Number of sutures:  3 Approximation:    Approximation:  Close Post-procedure details:    Dressing:  Antibiotic ointment and non-adherent dressing   Procedure completion:  Tolerated Comments:     Damaged portion of nail was removed.  The underlying nailbed was repaired.  The nail matrix appeared intact and had remaining viable  nail.  ____________________________________________  INITIAL IMPRESSION / MDM / ASSESSMENT AND PLAN / ED COURSE  As part of my medical decision making, I reviewed the following data within the [place] notes reviewed and incorporated, Old chart reviewed, Notes from prior ED visits, and [place] Controlled  Substance Database       *RICKEA SCHUTZ was evaluated in Emergency Department on 05/04/2021 for the symptoms described in the history of present illness. She was evaluated in the context of the global COVID-19 pandemic, which necessitated consideration that the patient might be at risk for infection with the SARS-CoV-2 virus that causes COVID-19. Institutional protocols and algorithms that pertain to the evaluation of patients at risk for COVID-19 are in a state of rapid change based on information released by regulatory bodies including the CDC and federal and state organizations. These policies and algorithms were followed during the patient's care in the ED.  Some ED evaluations and interventions may be delayed as a result of limited staffing during the pandemic.*     Medical Decision Making:  60 yo F here with crush injury to the left middle finger.  Imaging shows comminuted fracture of the distal phalanx.  Tetanus updated, patient started on empiric antibiotics.  The laceration was repaired as above.  She did have injury to the nailbed but the nail matrix appeared to be intact with intact nail.  The nail had been lacerated through its distal half.  The injured nail was removed, and underlying nailbed repaired with absorbable sutures.  Patient will be discharged with empiric antibiotics, splint, and outpatient Ortho follow-up. ____________________________________________  FINAL CLINICAL IMPRESSION(S) / ED DIAGNOSES  Final diagnoses:  Laceration of left middle finger with damage to nail, foreign body presence unspecified, initial encounter     MEDICATIONS GIVEN DURING  THIS VISIT:  Medications  bacitracin ointment (1 application Topical Given 05/04/21 0304)  bupivacaine (MARCAINE) 0.5 % injection 10 mL (10 mLs Infiltration Given 05/04/21 0113)  Tdap (BOOSTRIX) injection 0.5 mL (0.5 mLs Intramuscular Given 05/04/21 0303)     ED Discharge Orders          Ordered    cephALEXin (KEFLEX) 500 MG capsule  3 times daily        05/04/21 0253             Note:  This document was prepared using Dragon voice recognition software and may include unintentional dictation errors.   Duffy Bruce, MD 05/04/21 [id number]    Duffy Bruce, MD 05/04/21 [phone]

## 2021-05-04 NOTE — ED Notes (Signed)
ED Provider at bedside. 

## 2021-05-04 NOTE — ED Notes (Signed)
MD at bedside for finger block.

## 2021-05-04 NOTE — Discharge Instructions (Addendum)
Follow-up with an orthopedist in the next 7-10 days  Call the number provided  Discuss with your worker's comp to arrange appropriate follow-up  No weight bearing or use of the finger until cleared, or for at least 1 week

## 2021-05-06 ENCOUNTER — Other Ambulatory Visit: Payer: Self-pay

## 2021-05-06 ENCOUNTER — Ambulatory Visit: Payer: 59 | Admitting: Podiatry

## 2021-05-06 DIAGNOSIS — L600 Ingrowing nail: Secondary | ICD-10-CM

## 2021-05-06 MED ORDER — NEOMYCIN-POLYMYXIN-HC 3.5-10000-1 OT SUSP: OTIC | 0 refills | Status: DC

## 2021-05-06 NOTE — Progress Notes (Signed)
  Subjective:  [name], female    DOB: Jul 07, 1961,  MRN: [id number]  Chief Complaint  Patient presents with   Nail Problem    60 y.o. female presents with the above complaint. History confirmed with patient.  Medial border the left hallux nail had a pedicure recently and thinks there is still a piece of nail left in there is very painful and tender and sore.  She recent broke her finger.  Objective:  Physical Exam: warm, good capillary refill, no trophic changes or ulcerative lesions, normal DP and PT pulses, and normal sensory exam. Left Foot: Ingrown medial hallux border no paronychia Assessment:   1. Ingrowing left great toenail      Plan:  Patient was evaluated and treated and all questions answered.    Ingrown Nail, left -Patient elects to proceed with minor surgery to remove ingrown toenail today. Consent reviewed and signed by patient. -Ingrown nail excised. See procedure note. -Educated on post-procedure care including soaking. Written instructions provided and reviewed. -Patient to follow up in 2 weeks for nail check. -Cortisporin Rx for post care  Procedure: Excision of Ingrown Toenail Location: Left 1st toe medial nail borders. Anesthesia: Lidocaine 1% plain; 1.5 mL and Marcaine 0.5% plain; 1.5 mL, digital block. Skin Prep: Betadine. Dressing: Silvadene; telfa; dry, sterile, compression dressing. Technique: Following skin prep, the toe was exsanguinated and a tourniquet was secured at the base of the toe. The affected nail border was freed, split with a nail splitter, and excised. Chemical matrixectomy was then performed with phenol and irrigated out with alcohol. The tourniquet was then removed and sterile dressing applied. Disposition: Patient tolerated procedure well. Patient to return in 2 weeks for follow-up.    Return in about 2 weeks (around 05/20/2021) for nail re-check.

## 2021-05-06 NOTE — Patient Instructions (Signed)

## 2021-05-20 ENCOUNTER — Other Ambulatory Visit: Payer: Self-pay

## 2021-05-20 ENCOUNTER — Ambulatory Visit: Payer: 59 | Admitting: Podiatry

## 2021-05-20 DIAGNOSIS — L600 Ingrowing nail: Secondary | ICD-10-CM

## 2021-05-21 NOTE — Progress Notes (Signed)
  Subjective:  [name], female    DOB: 11/28/1960,  MRN: [id number]  Chief Complaint  Patient presents with   Ingrown Toenail        2 week follow up, nail check left    60 y.o. female returns for follow-up with the above complaint. History confirmed with patient.  Overall doing well  Objective:  Physical Exam: warm, good capillary refill, no trophic changes or ulcerative lesions, normal DP and PT pulses, and normal sensory exam. Left Foot: Matricectomy site is healing well Assessment:   1. Ingrowing left great toenail       Plan:  Patient was evaluated and treated and all questions answered.    Ingrown Nail, left -Doing well she can leave this open to air now and allow to continue to heal.  She would like the right side done at some point she return to see me as needed for this   No follow-ups on file.

## 2021-07-18 ENCOUNTER — Encounter: Payer: Self-pay | Admitting: Internal Medicine

## 2021-07-18 ENCOUNTER — Ambulatory Visit: Payer: Self-pay | Admitting: *Deleted

## 2021-07-18 ENCOUNTER — Other Ambulatory Visit: Payer: Self-pay | Admitting: Internal Medicine

## 2021-07-18 ENCOUNTER — Other Ambulatory Visit: Payer: Self-pay

## 2021-07-18 ENCOUNTER — Ambulatory Visit (INDEPENDENT_AMBULATORY_CARE_PROVIDER_SITE_OTHER): Payer: 59 | Admitting: Internal Medicine

## 2021-07-18 VITALS — BP 128/70 | HR 76 | Ht 65.0 in | Wt 167.6 lb

## 2021-07-18 DIAGNOSIS — N3001 Acute cystitis with hematuria: Secondary | ICD-10-CM

## 2021-07-18 LAB — POCT URINALYSIS DIPSTICK
Bilirubin, UA: NEGATIVE
Glucose, UA: NEGATIVE
Ketones, UA: NEGATIVE
Nitrite, UA: NEGATIVE
Protein, UA: POSITIVE — AB
Spec Grav, UA: 1.02 (ref 1.010–1.025)
Urobilinogen, UA: 0.2 E.U./dL
pH, UA: 6 (ref 5.0–8.0)

## 2021-07-18 MED ORDER — SULFAMETHOXAZOLE-TRIMETHOPRIM 800-160 MG PO TABS: 1.0000 | ORAL_TABLET | Freq: Two times a day (BID) | ORAL | 0 refills | Status: AC

## 2021-07-18 NOTE — Progress Notes (Signed)
Date:  07/18/2021   Name:  Yvette Barr   DOB:  [birth year]   MRN:  [id number]   Chief Complaint: Urinary Tract Infection  Urinary Tract Infection  This is a new problem. The current episode started today. The problem occurs every urination. The problem has been rapidly worsening. The quality of the pain is described as burning. The pain is moderate. There has been no fever. Associated symptoms include flank pain, frequency, hematuria and urgency. Pertinent negatives include no chills. She has tried nothing for the symptoms.   Lab Results  Component Value Date   CREATININE 0.90 01/14/2021   BUN 23 01/14/2021   NA 142 01/14/2021   K 5.0 01/14/2021   CL 106 01/14/2021   CO2 22 01/14/2021   Lab Results  Component Value Date   CHOL 171 01/14/2021   HDL 67 01/14/2021   LDLCALC 91 01/14/2021   TRIG 67 01/14/2021   CHOLHDL 2.6 01/14/2021   Lab Results  Component Value Date   TSH 2.080 01/14/2021   No results found for: HGBA1C Lab Results  Component Value Date   WBC 7.8 01/14/2021   HGB 14.1 01/14/2021   HCT 41.1 01/14/2021   MCV 95 01/14/2021   PLT 230 01/14/2021   Lab Results  Component Value Date   ALT 18 01/14/2021   AST 19 01/14/2021   ALKPHOS 62 01/14/2021   BILITOT <0.2 01/14/2021     Review of Systems  Constitutional:  Negative for chills, fatigue and fever.  Respiratory:  Negative for chest tightness.   Cardiovascular:  Negative for chest pain.  Gastrointestinal:  Negative for abdominal pain, constipation and diarrhea.  Genitourinary:  Positive for flank pain, frequency, hematuria and urgency.  Musculoskeletal:  Negative for back pain.   Patient Active Problem List   Diagnosis Date Noted   Bloating    Polyp of ascending colon    Generalized abdominal pain    Gastritis without bleeding    HNP (herniated nucleus pulposus), lumbar 11/27/2019   Moderate single current episode of major depressive disorder ([place]) 11/01/2019   Foot pain, right 11/01/2019    Muscle cramps 11/01/2019   S/P hip replacement, right 08/08/2017   Constipation 07/26/2015   Tobacco use disorder 07/26/2015   Insomnia 07/26/2015   Underweight 07/26/2015    Allergies  Allergen Reactions   Codeine Other (See Comments)    Family history of codeine allergy with rash   Morphine And Related     Family history of morphine reaction with rash   Morphine Nausea Only    Decreases BP    Past Surgical History:  Procedure Laterality Date   COLONOSCOPY     COLONOSCOPY WITH PROPOFOL N/A 03/18/2021   Procedure: COLONOSCOPY WITH PROPOFOL;  Surgeon: Lucilla Lame, MD;  Location: [place];  Service: Endoscopy;  Laterality: N/A;   ESOPHAGOGASTRODUODENOSCOPY (EGD) WITH PROPOFOL N/A 03/18/2021   Procedure: ESOPHAGOGASTRODUODENOSCOPY (EGD) WITH PROPOFOL;  Surgeon: Lucilla Lame, MD;  Location: [place];  Service: Endoscopy;  Laterality: N/A;   POLYPECTOMY N/A 03/18/2021   Procedure: POLYPECTOMY;  Surgeon: Lucilla Lame, MD;  Location: [place];  Service: Endoscopy;  Laterality: N/A;   SIGMOIDOSCOPY     TOTAL HIP ARTHROPLASTY Right 10/26/2017   Procedure: TOTAL HIP ARTHROPLASTY;  Surgeon: Dereck Leep, MD;  Location: [facility];  Service: Orthopedics;  Laterality: Right;    Social History   Tobacco Use   Smoking status: Every Day    Packs/day: 0.50  Years: 25.00    Pack years: 12.50    Types: Cigarettes   Smokeless tobacco: Never   Tobacco comments:       Vaping Use   Vaping Use: Never used  Substance Use Topics   Alcohol use: Yes    Alcohol/week: 2.0 standard drinks    Types: 2 Cans of beer per week    Comment: occasional   Drug use: No     Medication list has been reviewed and updated.  Current Meds  Medication Sig   Carboxymeth-Cellulose-CitricAc (PLENITY) CAPS Take 3 capsules by mouth 2 (two) times daily before lunch and supper.   sulfamethoxazole-trimethoprim (BACTRIM DS) 800-160 MG tablet Take 1 tablet by mouth 2 (two) times  daily for 10 days.    PHQ 2/9 Scores 07/18/2021 01/14/2021 04/24/2020 11/01/2019  PHQ - 2 Score 0 1 0 4  PHQ- 9 Score 6 6 9 9     GAD 7 : Generalized Anxiety Score 07/18/2021 01/14/2021 04/24/2020 11/01/2019  Nervous, Anxious, on Edge 0 1 0 1  Control/stop worrying 0 1 0 2  Worry too much - different things 0 1 0 2  Trouble relaxing 0 0 0 1  Restless 0 0 0 3  Easily annoyed or irritable 0 0 0 0  Afraid - awful might happen 0 0 0 3  Total GAD 7 Score 0 3 0 12  Anxiety Difficulty Not difficult at all - Not difficult at all Very difficult    BP Readings from Last 3 Encounters:  07/18/21 128/70  05/04/21 133/66  03/18/21 122/81    Physical Exam Vitals and nursing note reviewed.  Constitutional:      Appearance: She is well-developed.  Cardiovascular:     Rate and Rhythm: Normal rate and regular rhythm.     Heart sounds: Normal heart sounds.  Pulmonary:     Effort: Pulmonary effort is normal. No respiratory distress.     Breath sounds: Normal breath sounds.  Abdominal:     General: Bowel sounds are normal.     Palpations: Abdomen is soft.     Tenderness: There is abdominal tenderness in the suprapubic area. There is no right CVA tenderness, left CVA tenderness, guarding or rebound.    Wt Readings from Last 3 Encounters:  07/18/21 167 lb 9.6 oz (76 kg)  05/03/21 155 lb (70.3 kg)  03/18/21 163 lb 6.4 oz (74.1 kg)    BP 128/70   Pulse 76   Ht 5\' 5"  (1.651 m)   Wt 167 lb 9.6 oz (76 kg)   SpO2 98%   BMI 27.89 kg/m   Assessment and Plan: 1. Acute cystitis with hematuria Continue to push fluids Will advise when culture returns - sulfamethoxazole-trimethoprim (BACTRIM DS) 800-160 MG tablet; Take 1 tablet by mouth 2 (two) times daily for 10 days.  Dispense: 20 tablet; Refill: 0 - POCT urinalysis dipstick - Urine Culture   Partially dictated using Editor, commissioning. Any errors are unintentional.  Halina Maidens, MD [facility]  07/18/2021

## 2021-07-18 NOTE — Telephone Encounter (Signed)
Reason for Disposition  [1] Pain or burning with passing urine AND [2] side (flank) or back pain present  Answer Assessment - Initial Assessment Questions 1. COLOR of URINE: "Describe the color of the urine."  (e.g., tea-colored, pink, red, blood clots, bloody)     Pt calling in.   She is having blood in urine, burning with urination, frequency, right flank pain, abd pain in bottom area under belly button. 2. ONSET: "When did the bleeding start?"      This morning  3. EPISODES: "How many times has there been blood in the urine?" or "How many times today?"     4-5 times this morning 4. PAIN with URINATION: "Is there any pain with passing your urine?" If Yes, ask: "How bad is the pain?"  (Scale 1-10; or mild, moderate, severe)    - MILD - complains slightly about urination hurting    - MODERATE - interferes with normal activities      - SEVERE - excruciating, unwilling or unable to urinate because of the pain      Yes burning. 5. FEVER: "Do you have a fever?" If Yes, ask: "What is your temperature, how was it measured, and when did it start?"     I'm not sure. 6. ASSOCIATED SYMPTOMS: "Are you passing urine more frequently than usual?"     Yes 7. OTHER SYMPTOMS: "Do you have any other symptoms?" (e.g., back/flank pain, abdominal pain, vomiting)     Right flank pain, abd pain lower middle area under navel.    8. PREGNANCY: "Is there any chance you are pregnant?" "When was your last menstrual period?"     No  Protocols used: Urine - Blood In-A-AH

## 2021-07-18 NOTE — Telephone Encounter (Signed)
Pt called in c/o having bright red blood in her urine, frequency, burning with urination and right flank pain that all started this morning.   She's had 4-5 episodes of urination with blood in it this morning.  I called into [place] as there are no appts available with Dr. Army Melia within the 4 hr protocol timeframe.   They requested I send a message to the office and they will call the pt back and after consulting with Dr. Army Melia.    I let pt know this and confirmed her number.   She was agreeable to this plan.    I sent my information to [facility]

## 2021-07-22 LAB — URINE CULTURE

## 2021-09-27 ENCOUNTER — Ambulatory Visit: Payer: Self-pay

## 2021-09-27 NOTE — Telephone Encounter (Signed)
The patient is currently experiencing sinus discomfort and congestion   The patient shares that they've experienced the symptoms for less than a week   The patient would like to be prescribed something for their discomfort   Left message to call back about symptoms.

## 2021-09-27 NOTE — Telephone Encounter (Signed)
3rd attempt, pt called, left VM. Unable to reach patient after 3 attempts by [facility] NT, routing to the provider for resolution per protocol.

## 2021-09-27 NOTE — Telephone Encounter (Signed)
2nd attempt, Patient called, left VM to return the call to the office to discuss symptoms with a nurse.  

## 2022-01-16 ENCOUNTER — Encounter: Payer: 59 | Admitting: Internal Medicine

## 2022-03-03 ENCOUNTER — Encounter: Payer: Self-pay | Admitting: Internal Medicine

## 2022-03-03 ENCOUNTER — Ambulatory Visit: Payer: 59 | Admitting: Internal Medicine

## 2022-03-03 NOTE — Progress Notes (Deleted)
Date:  03/03/2022   Name:  Yvette Barr   DOB:  09-19-1961   MRN:  [id number]   Chief Complaint: No chief complaint on file. Yvette Barr is a 61 y.o. female who presents today for her Complete Annual Exam. She feels {DESC; WELL/FAIRLY WELL/POORLY:18703}. She reports exercising ***. She reports she is sleeping {DESC; WELL/FAIRLY WELL/POORLY:18703}. Breast complaints ***.  Mammogram: 01/2021 left breast asymmetry - f/u normal DEXA: none Pap smear: 12/2020 neg with co-testing Colonoscopy: 02/2021 repeat 7 yrs  Health Maintenance Due  Topic Date Due   COVID-19 Vaccine (1) Never done   HIV Screening  Never done   Zoster Vaccines- Shingrix (1 of 2) Never done   MAMMOGRAM  02/05/2022    Immunization History  Administered Date(s) Administered   Influenza,inj,Quad PF,6+ Mos 10/27/2017   Influenza-Unspecified 07/01/2015   Tdap 05/04/2021    HPI  Lab Results  Component Value Date   NA 142 01/14/2021   K 5.0 01/14/2021   CO2 22 01/14/2021   GLUCOSE 80 01/14/2021   BUN 23 01/14/2021   CREATININE 0.90 01/14/2021   CALCIUM 9.1 01/14/2021   EGFR 75 01/14/2021   GFRNONAA 60 11/01/2019   Lab Results  Component Value Date   CHOL 171 01/14/2021   HDL 67 01/14/2021   LDLCALC 91 01/14/2021   TRIG 67 01/14/2021   CHOLHDL 2.6 01/14/2021   Lab Results  Component Value Date   TSH 2.080 01/14/2021   No results found for: HGBA1C Lab Results  Component Value Date   WBC 7.8 01/14/2021   HGB 14.1 01/14/2021   HCT 41.1 01/14/2021   MCV 95 01/14/2021   PLT 230 01/14/2021   Lab Results  Component Value Date   ALT 18 01/14/2021   AST 19 01/14/2021   ALKPHOS 62 01/14/2021   BILITOT <0.2 01/14/2021   No results found for: 25OHVITD2, 25OHVITD3, VD25OH   Review of Systems  Constitutional:  Negative for chills, fatigue and fever.  HENT:  Negative for congestion, hearing loss, tinnitus, trouble swallowing and voice change.   Eyes:  Negative for visual disturbance.  Respiratory:   Negative for cough, chest tightness, shortness of breath and wheezing.   Cardiovascular:  Negative for chest pain, palpitations and leg swelling.  Gastrointestinal:  Negative for abdominal pain, constipation, diarrhea and vomiting.  Endocrine: Negative for polydipsia and polyuria.  Genitourinary:  Negative for dysuria, frequency, genital sores, vaginal bleeding and vaginal discharge.  Musculoskeletal:  Negative for arthralgias, gait problem and joint swelling.  Skin:  Negative for color change and rash.  Neurological:  Negative for dizziness, tremors, light-headedness and headaches.  Hematological:  Negative for adenopathy. Does not bruise/bleed easily.  Psychiatric/Behavioral:  Negative for dysphoric mood and sleep disturbance. The patient is not nervous/anxious.    Patient Active Problem List   Diagnosis Date Noted   Bloating    Polyp of ascending colon    Generalized abdominal pain    Gastritis without bleeding    HNP (herniated nucleus pulposus), lumbar 11/27/2019   Moderate single current episode of major depressive disorder ([place]) 11/01/2019   Foot pain, right 11/01/2019   Muscle cramps 11/01/2019   S/P hip replacement, right 08/08/2017   Constipation 07/26/2015   Tobacco use disorder 07/26/2015   Insomnia 07/26/2015   Underweight 07/26/2015    Allergies  Allergen Reactions   Codeine Other (See Comments)    Family history of codeine allergy with rash   Morphine And Related     Family history  of morphine reaction with rash   Morphine Nausea Only    Decreases BP    Past Surgical History:  Procedure Laterality Date   COLONOSCOPY     COLONOSCOPY WITH PROPOFOL N/A 03/18/2021   Procedure: COLONOSCOPY WITH PROPOFOL;  Surgeon: Lucilla Lame, MD;  Location: [facility];  Service: Endoscopy;  Laterality: N/A;   ESOPHAGOGASTRODUODENOSCOPY (EGD) WITH PROPOFOL N/A 03/18/2021   Procedure: ESOPHAGOGASTRODUODENOSCOPY (EGD) WITH PROPOFOL;  Surgeon: Lucilla Lame, MD;  Location:  [facility];  Service: Endoscopy;  Laterality: N/A;   POLYPECTOMY N/A 03/18/2021   Procedure: POLYPECTOMY;  Surgeon: Lucilla Lame, MD;  Location: [facility];  Service: Endoscopy;  Laterality: N/A;   SIGMOIDOSCOPY     TOTAL HIP ARTHROPLASTY Right 10/26/2017   Procedure: TOTAL HIP ARTHROPLASTY;  Surgeon: Dereck Leep, MD;  Location: [facility];  Service: Orthopedics;  Laterality: Right;    Social History   Tobacco Use   Smoking status: Every Day    Packs/day: 0.50    Years: 25.00    Pack years: 12.50    Types: Cigarettes   Smokeless tobacco: Never   Tobacco comments:       Vaping Use   Vaping Use: Never used  Substance Use Topics   Alcohol use: Yes    Alcohol/week: 2.0 standard drinks    Types: 2 Cans of beer per week    Comment: occasional   Drug use: No     Medication list has been reviewed and updated.  No outpatient medications have been marked as taking for the 03/03/22 encounter (Appointment) with Glean Hess, MD.       07/18/2021   11:18 AM 01/14/2021    7:57 AM 04/24/2020    3:33 PM 11/01/2019    2:37 PM  GAD 7 : Generalized Anxiety Score  Nervous, Anxious, on Edge 0 1 0 1  Control/stop worrying 0 1 0 2  Worry too much - different things 0 1 0 2  Trouble relaxing 0 0 0 1  Restless 0 0 0 3  Easily annoyed or irritable 0 0 0 0  Afraid - awful might happen 0 0 0 3  Total GAD 7 Score 0 3 0 12  Anxiety Difficulty Not difficult at all  Not difficult at all Very difficult       07/18/2021   11:17 AM  Depression screen PHQ 2/9  Decreased Interest 0  Down, Depressed, Hopeless 0  PHQ - 2 Score 0  Altered sleeping 2  Tired, decreased energy 2  Change in appetite 2  Feeling bad or failure about yourself  0  Trouble concentrating 0  Moving slowly or fidgety/restless 0  Suicidal thoughts 0  PHQ-9 Score 6  Difficult doing work/chores Not difficult at all    BP Readings from Last 3 Encounters:  07/18/21 128/70  05/04/21 133/66  03/18/21  122/81    Physical Exam Vitals and nursing note reviewed.  Constitutional:      General: She is not in acute distress.    Appearance: She is well-developed.  HENT:     Head: Normocephalic and atraumatic.     Right Ear: Tympanic membrane and ear canal normal.     Left Ear: Tympanic membrane and ear canal normal.     Nose:     Right Sinus: No maxillary sinus tenderness.     Left Sinus: No maxillary sinus tenderness.  Eyes:     General: No scleral icterus.       Right eye:  No discharge.        Left eye: No discharge.     Conjunctiva/sclera: Conjunctivae normal.  Neck:     Thyroid: No thyromegaly.     Vascular: No carotid bruit.  Cardiovascular:     Rate and Rhythm: Normal rate and regular rhythm.     Pulses: Normal pulses.     Heart sounds: Normal heart sounds.  Pulmonary:     Effort: Pulmonary effort is normal. No respiratory distress.     Breath sounds: No wheezing.  Chest:  Breasts:    Right: No mass, nipple discharge, skin change or tenderness.     Left: No mass, nipple discharge, skin change or tenderness.  Abdominal:     General: Bowel sounds are normal.     Palpations: Abdomen is soft.     Tenderness: There is no abdominal tenderness.  Musculoskeletal:     Cervical back: Normal range of motion. No erythema.     Right lower leg: No edema.     Left lower leg: No edema.  Lymphadenopathy:     Cervical: No cervical adenopathy.  Skin:    General: Skin is warm and dry.     Findings: No rash.  Neurological:     Mental Status: She is alert and oriented to person, place, and time.     Cranial Nerves: No cranial nerve deficit.     Sensory: No sensory deficit.     Deep Tendon Reflexes: Reflexes are normal and symmetric.  Psychiatric:        Attention and Perception: Attention normal.        Mood and Affect: Mood normal.    Wt Readings from Last 3 Encounters:  07/18/21 167 lb 9.6 oz (76 kg)  05/03/21 155 lb (70.3 kg)  03/18/21 163 lb 6.4 oz (74.1 kg)    There  were no vitals taken for this visit.  Assessment and Plan:

## 2022-03-04 NOTE — Progress Notes (Signed)
No show

## 2022-03-17 ENCOUNTER — Encounter: Payer: Self-pay | Admitting: Podiatry

## 2022-03-17 ENCOUNTER — Ambulatory Visit: Payer: 59 | Admitting: Podiatry

## 2022-03-17 DIAGNOSIS — L603 Nail dystrophy: Secondary | ICD-10-CM | POA: Diagnosis not present

## 2022-03-17 NOTE — Progress Notes (Signed)
She presents today for chief complaint of painful hallux nails bilaterally she states that I am not sure that the toenails are ingrown but the still toed boots really seem to bother my toes and makes him sore.  Objective: Vital signs are stable she is alert and oriented x3 is previously had matrixectomy performed to the hallux left which is gone on to heal very nicely.  Margins are fairly visible.  There is no signs of erythema cellulitis drainage or odor.  Mild tenderness on palpation of the nail in general.  She does have extension at the hallux interphalangeal joint consistent with rubbing of the toenails and her in her still toed shoes.  This is causing the toenails to be thick and tender.  Assessment: Digital deformity resulting in rubbing of her toenails and her boots causing pain.  Plan: I recommended that she follow-up with her nail tech to keep the nails cut short and filed thin as possible.  Also recommended deeper toe box on her boots if possible.

## 2022-04-08 ENCOUNTER — Ambulatory Visit: Payer: Self-pay

## 2022-04-08 ENCOUNTER — Telehealth: Payer: Self-pay | Admitting: Gastroenterology

## 2022-04-08 NOTE — Telephone Encounter (Signed)
Summary: rash on arm   Pt called saying she has a rash on her arm that comes and goes and her employer would like her to be seen today.  The first appt with Dr. Army Melia is next Monday.   CB#  [phone]     Called py - LMOM to return call

## 2022-04-08 NOTE — Telephone Encounter (Signed)
Called and left VM for patient to call back and ask for me directly so I can triage further. We need to know if she has open wounds, or what is the extent of the spots on her arms. If they are open, she may need to go to urgent care to have evaluated.

## 2022-04-08 NOTE — Telephone Encounter (Signed)
Summary: rash on arm   Pt called saying she has a rash on her arm that comes and goes and her employer would like her to be seen today.  The first appt with Dr. Army Melia is next Monday.   CB#  [phone]     Called pt - lmom to return call.

## 2022-04-08 NOTE — Telephone Encounter (Signed)
Patient called stating she saw Dr Allen Norris a year ago and her insurance wouldn't pay for the prescription then but now she got a letter that it was approved. Patient is wondering if you can call that prescription into [facility] in [place].

## 2022-04-08 NOTE — Telephone Encounter (Signed)
    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 Told pt. She should "get checked out for this." Symptoms: No pain Frequency: Friday Pertinent Negatives: Patient denies pain Disposition: '[]'$ ED /'[]'$ Urgent Care (no appt availability in office) / '[]'$ Appointment(In office/virtual)/ '[]'$  [facility]/ '[]'$ Home Care/ '[]'$ Refused Recommended Disposition /'[]'$ [facility]/ '[x]'$  Follow-up with PCP Additional Notes: States she was not injured at work and requests to be seen. Levada Dy in the practice requests triage note be sent over for review.  Answer Assessment - Initial Assessment Questions 1. MECHANISM: "How did the injury happen?"     Cleaning at work 2. ONSET: "When did the injury happen?" (Minutes or hours ago)      Last week 3. LOCATION: "Where is the injury located?" "Which arm?"     Both arms  - skin is open 4. APPEARANCE of INJURY: "What does the injury look like?"      Areas are 2-3 inches 5. SEVERITY: "Can you use the arm normally?"      Yes 6. SWELLING or BRUISING: "is there any swelling or bruising?" If Yes, ask: "How large is it? (e.g., inches, centimeters)      Bruising 7. PAIN: "Is there pain?" If Yes, ask: "How bad is the pain?"    (Scale 1-10; or mild, moderate, severe)   - NONE (0): no pain.   - MILD (1-3): doesn't interfere with normal activities   - MODERATE (4-7): interferes with normal activities (e.g., work or school) or awakens from sleep   - SEVERE (8-10): excruciating pain, unable to do any normal activities, unable to hold a cup of water     None 8. TETANUS: For any breaks in the skin, ask: "When was the last tetanus booster?"     Unsure 9. OTHER SYMPTOMS: "Do you have any other symptoms?"  (e.g., numbness in hand)     No 10. PREGNANCY: "Is there any chance you are pregnant?" "When was your last menstrual period?"       No  Protocols used: Arm  Injury-A-AH

## 2022-04-09 MED ORDER — TRULANCE 3 MG PO TABS: 1.0000 | ORAL_TABLET | Freq: Every day | ORAL | 4 refills | Status: DC

## 2022-04-09 NOTE — Addendum Note (Signed)
Addended by: Lurlean Nanny on: 04/09/2022 03:06 PM   Modules accepted: Orders

## 2022-04-09 NOTE — Telephone Encounter (Signed)
F/u med refill appt scheduled... Rx sent through [facility]--- pt stated that insurance did not cover when it was originally prescribed last year.

## 2022-04-09 NOTE — Addendum Note (Signed)
Addended by: Lurlean Nanny on: 04/09/2022 11:58 AM   Modules accepted: Orders

## 2022-04-14 ENCOUNTER — Ambulatory Visit: Payer: 59 | Admitting: Internal Medicine

## 2022-04-16 ENCOUNTER — Ambulatory Visit (INDEPENDENT_AMBULATORY_CARE_PROVIDER_SITE_OTHER): Payer: 59 | Admitting: Internal Medicine

## 2022-04-16 ENCOUNTER — Encounter: Payer: Self-pay | Admitting: Internal Medicine

## 2022-04-16 VITALS — BP 124/88 | HR 73 | Ht 65.0 in | Wt 168.0 lb

## 2022-04-16 DIAGNOSIS — R233 Spontaneous ecchymoses: Secondary | ICD-10-CM | POA: Diagnosis not present

## 2022-04-16 DIAGNOSIS — F419 Anxiety disorder, unspecified: Secondary | ICD-10-CM | POA: Diagnosis not present

## 2022-04-16 DIAGNOSIS — Z6827 Body mass index (BMI) 27.0-27.9, adult: Secondary | ICD-10-CM

## 2022-04-16 MED ORDER — PHENTERMINE HCL 37.5 MG PO CAPS: 37.5000 mg | ORAL_CAPSULE | ORAL | 0 refills | Status: DC

## 2022-04-16 NOTE — Progress Notes (Signed)
Date:  04/16/2022   Name:  Yvette Barr   DOB:  [birth year]   MRN:  [id number]   Chief Complaint: Bleeding/Bruising  Rash This is a recurrent (ecchymoses of forearms with minor trauma) problem. The affected locations include the left arm and right arm. The rash is characterized by redness and bruising. Pertinent negatives include no shortness of breath.  Anxiety Presents for initial visit. Onset was 1 to 4 weeks ago (since her husband was committed for suicidal ideation after his brother commited suicide). The problem has been unchanged. Symptoms include excessive worry, muscle tension and nervous/anxious behavior. Patient reports no chest pain, dizziness, shortness of breath or suicidal ideas. Symptoms occur constantly. The symptoms are aggravated by family issues.    Weight gain - she is stress eating over her husband's situation.  She is afraid to sleep in case he tries to hurt himself.  She has taken phentermine in the past.    Lab Results  Component Value Date   NA 142 01/14/2021   K 5.0 01/14/2021   CO2 22 01/14/2021   GLUCOSE 80 01/14/2021   BUN 23 01/14/2021   CREATININE 0.90 01/14/2021   CALCIUM 9.1 01/14/2021   EGFR 75 01/14/2021   GFRNONAA 60 11/01/2019   Lab Results  Component Value Date   CHOL 171 01/14/2021   HDL 67 01/14/2021   LDLCALC 91 01/14/2021   TRIG 67 01/14/2021   CHOLHDL 2.6 01/14/2021   Lab Results  Component Value Date   TSH 2.080 01/14/2021   No results found for: "HGBA1C" Lab Results  Component Value Date   WBC 7.8 01/14/2021   HGB 14.1 01/14/2021   HCT 41.1 01/14/2021   MCV 95 01/14/2021   PLT 230 01/14/2021   Lab Results  Component Value Date   ALT 18 01/14/2021   AST 19 01/14/2021   ALKPHOS 62 01/14/2021   BILITOT <0.2 01/14/2021   No results found for: "25OHVITD2", "25OHVITD3", "VD25OH"   Review of Systems  Constitutional:  Positive for appetite change and unexpected weight change.  Respiratory:  Negative for chest  tightness and shortness of breath.   Cardiovascular:  Negative for chest pain.  Skin:  Positive for rash.  Neurological:  Negative for dizziness and headaches.  Psychiatric/Behavioral:  Positive for sleep disturbance. Negative for dysphoric mood and suicidal ideas. The patient is nervous/anxious.     Patient Active Problem List   Diagnosis Date Noted   Sessile serrated polyp of colon    Hx of gastritis    HNP (herniated nucleus pulposus), lumbar 11/27/2019   S/P hip replacement, right 08/08/2017   Constipation 07/26/2015   Tobacco use disorder 07/26/2015   Insomnia 07/26/2015   Underweight 07/26/2015    Allergies  Allergen Reactions   Codeine Other (See Comments)    Family history of codeine allergy with rash   Morphine And Related     Family history of morphine reaction with rash   Morphine Nausea Only    Decreases BP    Past Surgical History:  Procedure Laterality Date   COLONOSCOPY     COLONOSCOPY WITH PROPOFOL N/A 03/18/2021   Procedure: COLONOSCOPY WITH PROPOFOL;  Surgeon: Midge Minium, MD;  Location[facility];  Service: Endoscopy;  Laterality: N/A;   ESOPHAGOGASTRODUODENOSCOPY (EGD) WITH PROPOFOL N/A 03/18/2021   Procedure: ESOPHAGOGASTRODUODENOSCOPY (EGD) WITH PROPOFOL;  Surgeon: Midge Minium, MD;  Location: [facility];  Service: Endoscopy;  Laterality: N/A;   POLYPECTOMY N/A 03/18/2021   Procedure: POLYPECTOMY;  Surgeon:  Lucilla Lame, MD;  Location: [facility];  Service: Endoscopy;  Laterality: N/A;   SIGMOIDOSCOPY     TOTAL HIP ARTHROPLASTY Right 10/26/2017   Procedure: TOTAL HIP ARTHROPLASTY;  Surgeon: Dereck Leep, MD;  Location: [facility];  Service: Orthopedics;  Laterality: Right;    Social History   Tobacco Use   Smoking status: Every Day    Packs/day: 0.50    Years: 25.00    Total pack years: 12.50    Types: Cigarettes   Smokeless tobacco: Never   Tobacco comments:       Vaping Use   Vaping Use: Never used  Substance  Use Topics   Alcohol use: Yes    Alcohol/week: 2.0 standard drinks of alcohol    Types: 2 Cans of beer per week    Comment: occasional   Drug use: No     Medication list has been reviewed and updated.  Current Meds  Medication Sig   Plecanatide (TRULANCE) 3 MG TABS Take 1 tablet by mouth daily.       04/16/2022    3:53 PM 07/18/2021   11:18 AM 01/14/2021    7:57 AM 04/24/2020    3:33 PM  GAD 7 : Generalized Anxiety Score  Nervous, Anxious, on Edge 3 0 1 0  Control/stop worrying 3 0 1 0  Worry too much - different things 2 0 1 0  Trouble relaxing 2 0 0 0  Restless 2 0 0 0  Easily annoyed or irritable 3 0 0 0  Afraid - awful might happen 2 0 0 0  Total GAD 7 Score 17 0 3 0  Anxiety Difficulty Somewhat difficult Not difficult at all  Not difficult at all       04/16/2022    3:52 PM 07/18/2021   11:17 AM 01/14/2021    7:57 AM  Depression screen PHQ 2/9  Decreased Interest 0 0 0  Down, Depressed, Hopeless 3 0 1  PHQ - 2 Score 3 0 1  Altered sleeping $RemoveBeforeDE'2 2 3  'fMZaTnetLxlbTRt$ Tired, decreased energy $RemoveBeforeDE'2 2 2  'oeIEOoXSlTFglWk$ Change in appetite 3 2 0  Feeling bad or failure about yourself  1 0 0  Trouble concentrating 0 0 0  Moving slowly or fidgety/restless 0 0 0  Suicidal thoughts 0 0 0  PHQ-9 Score $RemoveBef'11 6 6  'PNcaOKlthJ$ Difficult doing work/chores Not difficult at all Not difficult at all     BP Readings from Last 3 Encounters:  04/16/22 124/88  07/18/21 128/70  05/04/21 133/66    Physical Exam Vitals and nursing note reviewed.  Constitutional:      General: She is not in acute distress.    Appearance: Normal appearance. She is well-developed.  HENT:     Head: Normocephalic and atraumatic.  Cardiovascular:     Rate and Rhythm: Normal rate and regular rhythm.  Pulmonary:     Effort: Pulmonary effort is normal. No respiratory distress.     Breath sounds: No wheezing or rhonchi.  Musculoskeletal:     Cervical back: Normal range of motion.     Right lower leg: No edema.     Left lower leg: No edema.   Skin:    General: Skin is warm and dry.     Findings: Ecchymosis present. No rash.     Comments: Superficial skin trauma -  Neurological:     Mental Status: She is alert and oriented to person, place, and time.  Psychiatric:  Mood and Affect: Mood normal.        Behavior: Behavior normal.     Wt Readings from Last 3 Encounters:  04/16/22 168 lb (76.2 kg)  07/18/21 167 lb 9.6 oz (76 kg)  05/03/21 155 lb (70.3 kg)    BP 124/88   Pulse 73   Ht $R'5\' 5"'[place]$  (1.651 m)   Wt 168 lb (76.2 kg)   SpO2 96%   BMI 27.96 kg/m   Assessment and Plan: 1. Easy bruising Patient is reassured - due to minor trauma Can take vitamin C supplements  2. Anxiety Due to situation with her husband No medication is indicated at this time  3. BMI 27.0-27.9,adult She needs to work on diet changes and exercise Will give one month of phentermine to jump start weight loss - phentermine 37.5 MG capsule; Take 1 capsule (37.5 mg total) by mouth every morning.  Dispense: 30 capsule; Refill: 0   Partially dictated using Editor, commissioning. Any errors are unintentional.  Halina Maidens, MD [facility]  04/16/2022

## 2022-05-08 IMAGING — CR DG ABDOMEN ACUTE W/ 1V CHEST
4 series · 4 of 4 positions shown · non-contrast
Comparison: 10/29/2015 and 11/07/2014

CLINICAL DATA: Constipation.

EXAM:
DG ABDOMEN ACUTE W/ 1V CHEST

[chest pa]
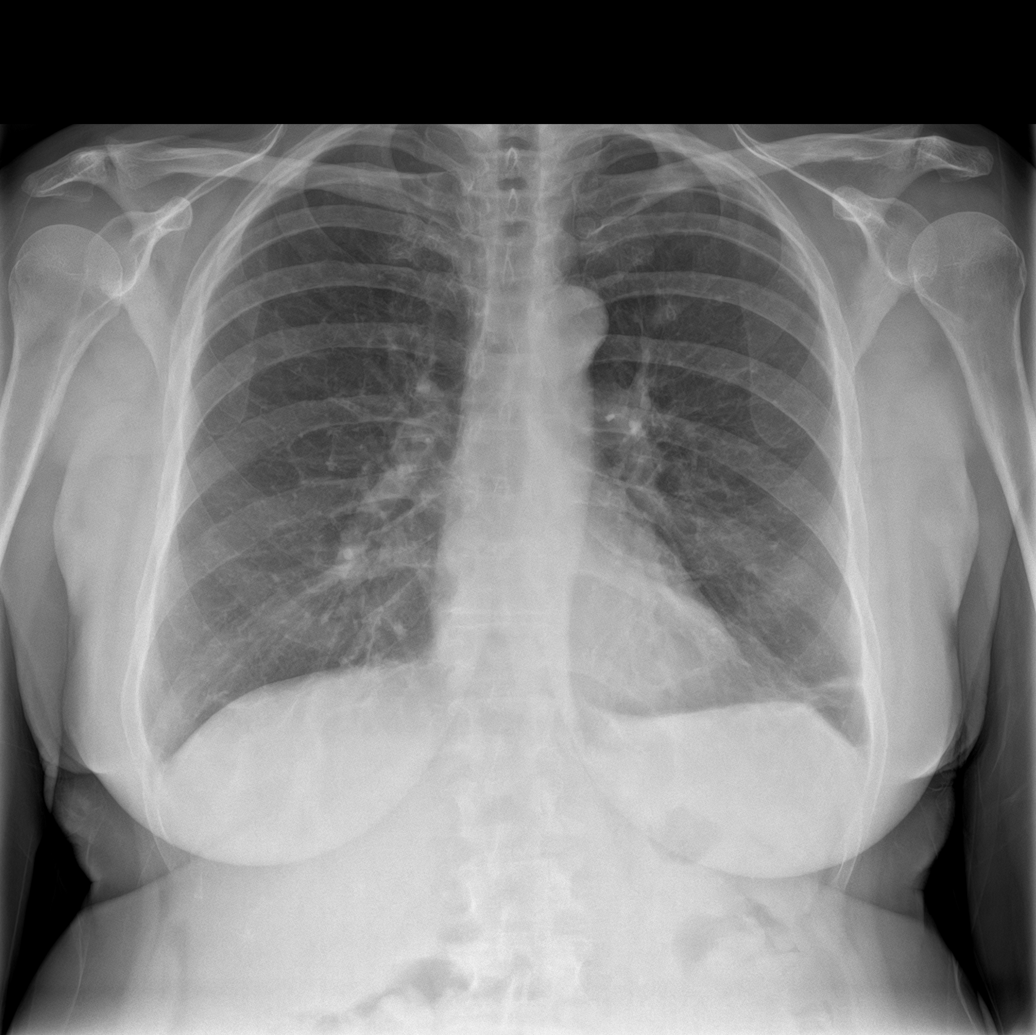

[abdomen erect]
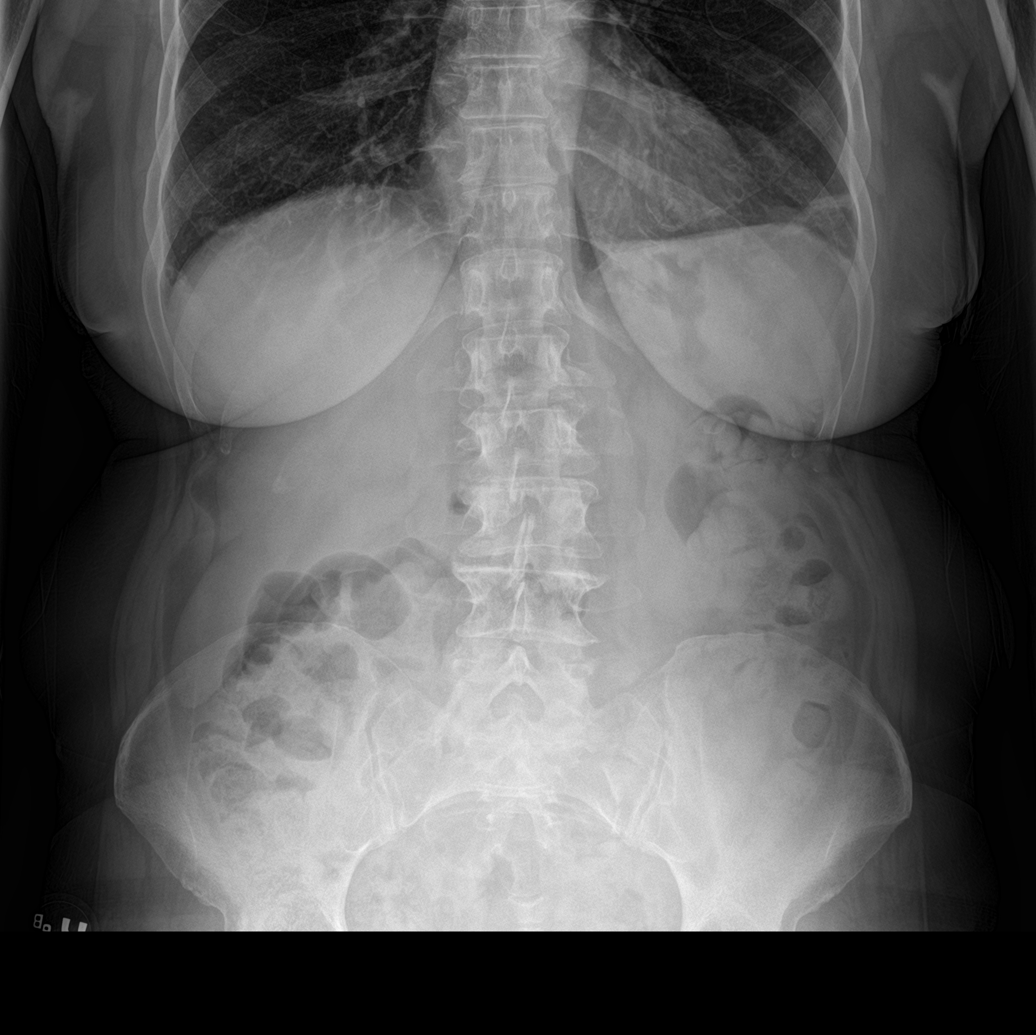

[abdomen supine (1 of 2)]
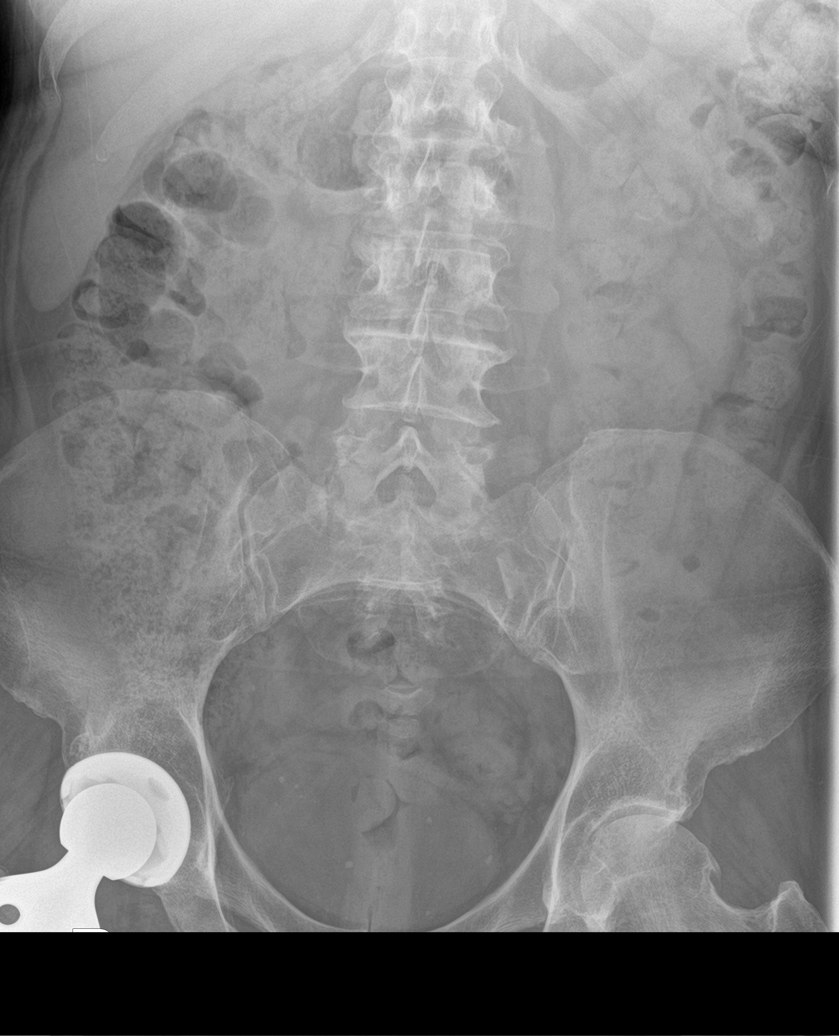

[abdomen supine (2 of 2)]
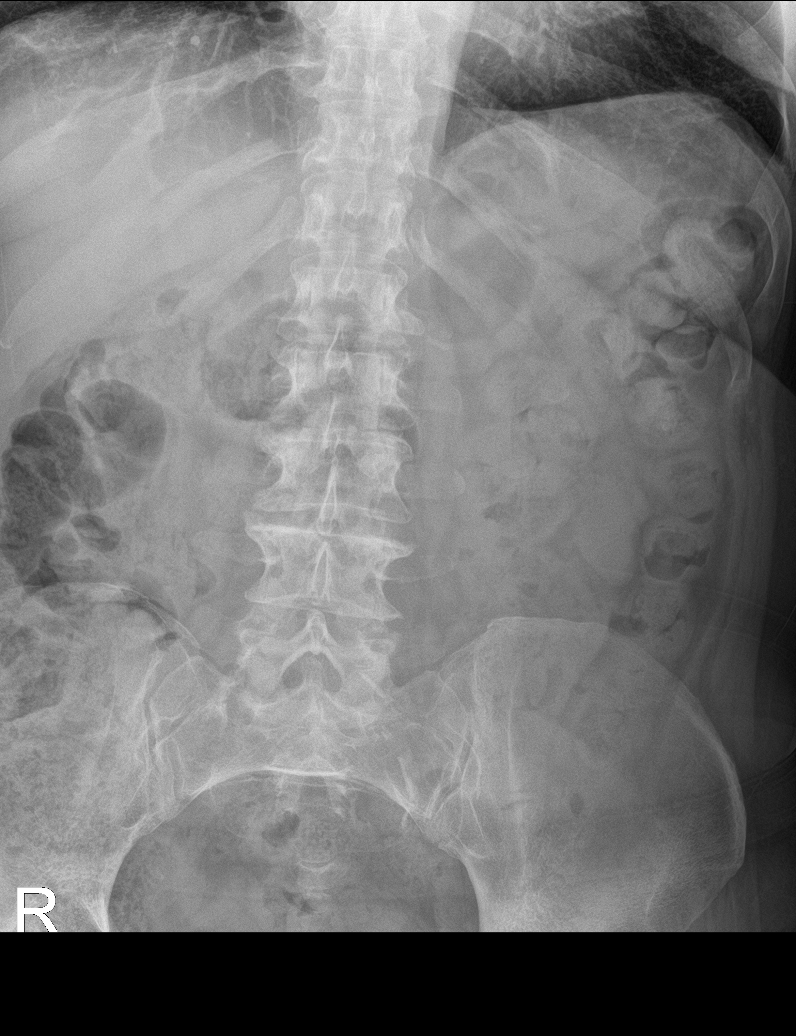

[4 of 4 positions shown; findings below may reference images not displayed]

FINDINGS: Linear densities at left lung base are most compatible with
atelectasis. Otherwise, the lungs are clear. Heart size is normal.
Negative for free air. Nonobstructive bowel gas pattern. Large
amount of stool in the abdomen and pelvis. Patient has a right hip
arthroplasty. Joint space narrowing in the medial left hip joint.
Small pelvic calcifications are suggestive for phleboliths.
IMPRESSION: 1. Large amount of stool in the abdomen and pelvis. Findings are
compatible with history of constipation. Nonobstructive bowel gas
pattern.
2. Densities at the left lung base are most compatible with
atelectasis.

## 2022-08-18 ENCOUNTER — Ambulatory Visit: Payer: 59 | Admitting: Gastroenterology

## 2022-08-18 NOTE — Progress Notes (Deleted)
    Primary Care Physician: Glean Hess, MD  Primary Gastroenterologist:  Dr. Lucilla Lame  No chief complaint on file.   HPI: Yvette Barr is a 61 y.o. female here with a history of constipation.  The patient was seen by me last year and started on Plecanatide [facility]) but due to her insurance she was not able to get the medication at that time.  The patient then contacted my office stating that they were going to cover the medication therefore she needs a prescription for it.  The patient now comes for follow-up for her chronic constipation.  Past Medical History:  Diagnosis Date   Ischemic colitis ([place])    Moderate single current episode of major depressive disorder ([place]) 11/01/2019    Current Outpatient Medications  Medication Sig Dispense Refill   phentermine 37.5 MG capsule Take 1 capsule (37.5 mg total) by mouth every morning. 30 capsule 0   Plecanatide (TRULANCE) 3 MG TABS Take 1 tablet by mouth daily. 30 tablet 4   No current facility-administered medications for this visit.    Allergies as of 08/18/2022 - Review Complete 04/16/2022  Allergen Reaction Noted   Codeine Other (See Comments) 05/29/2015   Morphine and related  05/29/2015   Morphine Nausea Only 11/07/2014    ROS:  General: Negative for anorexia, weight loss, fever, chills, fatigue, weakness. ENT: Negative for hoarseness, difficulty swallowing , nasal congestion. CV: Negative for chest pain, angina, palpitations, dyspnea on exertion, peripheral edema.  Respiratory: Negative for dyspnea at rest, dyspnea on exertion, cough, sputum, wheezing.  GI: See history of present illness. GU:  Negative for dysuria, hematuria, urinary incontinence, urinary frequency, nocturnal urination.  Endo: Negative for unusual weight change.    Physical Examination:   There were no vitals taken for this visit.  General: Well-nourished, well-developed in no acute distress.  Eyes: No icterus. Conjunctivae pink. Lungs:  Clear to auscultation bilaterally. Non-labored. Heart: Regular rate and rhythm, no murmurs rubs or gallops.  Abdomen: Bowel sounds are normal, nontender, nondistended, no hepatosplenomegaly or masses, no abdominal bruits or hernia , no rebound or guarding.   Extremities: No lower extremity edema. No clubbing or deformities. Neuro: Alert and oriented x 3.  Grossly intact. Skin: Warm and dry, no jaundice.   Psych: Alert and cooperative, normal mood and affect.  Labs:    Imaging Studies: No results found.  Assessment and Plan:   [name] is a 62 y.o. y/o female ***     Lucilla Lame, MD. [facility]    Note: This dictation was prepared with Dragon dictation along with smaller phrase technology. Any transcriptional errors that result from this process are unintentional.

## 2023-03-18 ENCOUNTER — Ambulatory Visit: Payer: 59 | Admitting: Podiatry

## 2023-03-18 DIAGNOSIS — M79676 Pain in unspecified toe(s): Secondary | ICD-10-CM | POA: Diagnosis not present

## 2023-03-18 DIAGNOSIS — B351 Tinea unguium: Secondary | ICD-10-CM

## 2023-03-18 NOTE — Progress Notes (Signed)
She presents today complaining of painful elongated toenails.  Objective: Toenails are long thick yellow dystrophic onychomycotic.  Assessment: Pain in limb secondary to onychomycosis.  Plan: Debridement of toenails 1 through 5 bilateral.

## 2023-03-26 ENCOUNTER — Telehealth: Payer: Self-pay

## 2023-03-26 NOTE — Telephone Encounter (Signed)
Patient due for Mammogram. Called and left VM asking pt to call back if she would like mammogram ordered.  - Yvette Barr

## 2023-04-10 ENCOUNTER — Telehealth: Payer: Self-pay

## 2023-04-10 NOTE — Telephone Encounter (Signed)
PA was submitted and was approved   04/10/2023 to 04/09/2024

## 2023-08-11 ENCOUNTER — Ambulatory Visit (INDEPENDENT_AMBULATORY_CARE_PROVIDER_SITE_OTHER): Payer: 59 | Admitting: Podiatry

## 2023-08-11 ENCOUNTER — Encounter: Payer: Self-pay | Admitting: Podiatry

## 2023-08-11 VITALS — Ht 65.0 in | Wt 168.0 lb

## 2023-08-11 DIAGNOSIS — B351 Tinea unguium: Secondary | ICD-10-CM

## 2023-08-11 DIAGNOSIS — M79675 Pain in left toe(s): Secondary | ICD-10-CM

## 2023-08-11 DIAGNOSIS — M79674 Pain in right toe(s): Secondary | ICD-10-CM

## 2023-08-11 NOTE — Progress Notes (Signed)
  Subjective:  [name], female    DOB: [birth year],  MRN: [id number]  Chief Complaint  Patient presents with   Nail Problem    RFC   62 y.o. female returns for the above complaint.  Patient presents with thickened and onychodystrophy mycotic toenails x 10 mild pain on palpation patient would like for me to debride she is unable to do it herself  Objective:  There were no vitals filed for this visit. Podiatric Exam: Vascular: dorsalis pedis and posterior tibial pulses are palpable bilateral. Capillary return is immediate. Temperature gradient is WNL. Skin turgor WNL  Sensorium: Normal Semmes Weinstein monofilament test. Normal tactile sensation bilaterally. Nail Exam: Pt has thick disfigured discolored nails with subungual debris noted bilateral entire nail hallux through fifth toenails.  Pain on palpation to the nails. Ulcer Exam: There is no evidence of ulcer or pre-ulcerative changes or infection. Orthopedic Exam: Muscle tone and strength are WNL. No limitations in general ROM. No crepitus or effusions noted.  Skin: No Porokeratosis. No infection or ulcers    Assessment & Plan:   1. Pain due to onychomycosis of toenails of both feet     Patient was evaluated and treated and all questions answered.  Onychomycosis with pain  -Nails palliatively debrided as below. -Educated on self-care  Procedure: Nail Debridement Rationale: pain  Type of Debridement: manual, sharp debridement. Instrumentation: Nail nipper, rotary burr. Number of Nails: 10  Procedures and Treatment: Consent by patient was obtained for treatment procedures. The patient understood the discussion of treatment and procedures well. All questions were answered thoroughly reviewed. Debridement of mycotic and hypertrophic toenails, 1 through 5 bilateral and clearing of subungual debris. No ulceration, no infection noted.  Return Visit-Office Procedure: Patient instructed to return to the office for a  follow up visit 3 months for continued evaluation and treatment.  Nicholes Rough, DPM    No follow-ups on file.

## 2023-10-08 ENCOUNTER — Emergency Department: Payer: 59

## 2023-10-08 ENCOUNTER — Emergency Department
Admission: EM | Admit: 2023-10-08 | Discharge: 2023-10-08 | Disposition: A | Discharge status: Home / Self Care | Payer: 59 | Attending: Emergency Medicine | Admitting: Emergency Medicine

## 2023-10-08 DIAGNOSIS — K5904 Chronic idiopathic constipation: Secondary | ICD-10-CM | POA: Diagnosis not present

## 2023-10-08 DIAGNOSIS — K5900 Constipation, unspecified: Secondary | ICD-10-CM | POA: Diagnosis present

## 2023-10-08 MED ORDER — LACTULOSE 10 GM/15ML PO SOLN: 20.0000 g | Freq: Two times a day (BID) | ORAL | 0 refills | Status: DC | PRN

## 2023-10-08 MED ORDER — LACTULOSE 10 GM/15ML PO SOLN
20.0000 g | Freq: Once | ORAL | Status: AC
  Administered 2023-10-08: 20 g via ORAL
  Filled 2023-10-08: qty 30

## 2023-10-08 MED ORDER — GLYCERIN (LAXATIVE) 1 G RE SUPP
1.0000 | Freq: Once | RECTAL | Status: DC
  Filled 2023-10-08: qty 1

## 2023-10-08 MED ORDER — FAMOTIDINE 20 MG PO TABS: 20.0000 mg | ORAL_TABLET | Freq: Two times a day (BID) | ORAL | 1 refills | Status: DC

## 2023-10-08 NOTE — ED Notes (Signed)
 Patient declined discharge vital signs.

## 2023-10-08 NOTE — Discharge Instructions (Addendum)
 Your exam and x-ray are consistent with chronic constipation.  There is no evidence of a bowel obstruction or fecal impaction at this time.  Take the lactulose  daily as directed for constipation.  Once your symptoms are resolved.  You should consider up to 3 times daily MiraLAX  to keep your stools regular.  You should also increase your fiber intake and consider several meatless days during the week to help reduce constipation symptoms.  Continue to hydrate daily.  Follow-up with your primary provider for ongoing care as discussed.

## 2023-10-08 NOTE — ED Triage Notes (Signed)
 Pt presents to ED with /co of possible rectal abscess due to white pus like draining and pt states she has had to wear a pad due to the "leaking". Pt states also constipation and states last BM was about a week ago, pt states constipation is normal.

## 2023-10-08 NOTE — ED Provider Notes (Signed)
 [facility] Emergency Department Provider Note     Event Date/Time   First MD Initiated Contact with Patient 10/08/23 1650     (approximate)   History   Abscess and Constipation   HPI  Yvette Barr is a 63 y.o. female with a history of chronic constipation, colitis, gastritis, and underweight who presents to the ED for concern for possible rectal abscess.  She would endorse white pus-like drainage from her rectum, she assumes.  She is endorsing drainage to the point she is worn a pad due to the leaking.  She reports her last bowel movement was about a week ago but constipation for her is chronic but is not currently managed with prescription or OTC regimens.  She was previously on Linzess  but reported adverse side effects, and reported dislike of OTC Metamucil.  No reports of any fevers, chills, sweats, chest pain, or shortness of breath.  Physical Exam   Triage Vital Signs: ED Triage Vitals  Encounter Vitals Group     BP 10/08/23 1432 (!) 167/98     Systolic BP Percentile --      Diastolic BP Percentile --      Pulse Rate 10/08/23 1428 85     Resp 10/08/23 1428 17     Temp 10/08/23 1428 98.2 F (36.8 C)     Temp Source 10/08/23 1428 Oral     SpO2 10/08/23 1428 100 %     Weight --      Height --      Head Circumference --      Peak Flow --      Pain Score 10/08/23 1429 3     Pain Loc --      Pain Education --      Exclude from Growth Chart --     Most recent vital signs: Vitals:   10/08/23 1428 10/08/23 1432  BP:  (!) 167/98  Pulse: 85   Resp: 17   Temp: 98.2 F (36.8 C)   SpO2: 100%     General Awake, no distress. NAD HEENT NCAT. PERRL. EOMI. No rhinorrhea. Mucous membranes are moist.  CV:  Good peripheral perfusion. RRR RESP:  Normal effort. CTA ABD:  No distention.  Soft and generally nontender to palpation.  Rectal exam noting normal rectal tone.  No evidence of any erythema, pointing, fluctuance, or skin puckering.  No  pilonidal sinus tracts appreciated.  No purulent drainage from the rectum or perineum.   ED Results / Procedures / Treatments   Labs (all labs ordered are listed, but only abnormal results are displayed) Labs Reviewed - No data to display   EKG   RADIOLOGY  I personally viewed and evaluated these images as part of my medical decision making, as well as reviewing the written report by the radiologist.  ED Provider Interpretation: stool burden noted no evidence of SBO or fecal impaction  DG Abdomen 1 View  Result Date: 10/08/2023 CLINICAL DATA:  Constipation. EXAM: ABDOMEN - 1 VIEW COMPARISON:  Radiographs 11/07/2014.  CT 11/14/2013. FINDINGS: There is a moderate amount of stool throughout the colon. No significant bowel distension. No supine evidence of bowel wall thickening or pneumoperitoneum. Scattered small pelvic calcifications are likely phleboliths. Interval right total hip arthroplasty without acute osseous abnormality. IMPRESSION: Moderate colonic stool burden without evidence of bowel obstruction or other acute process. Electronically Signed   By: Elsie Perone M.D.   On: 10/08/2023 15:15    PROCEDURES:  Critical Care  performed: No  Procedures   MEDICATIONS ORDERED IN ED: Medications  lactulose  (CHRONULAC ) 10 GM/15ML solution 20 g (20 g Oral Given 10/08/23 1826)     IMPRESSION / MDM / ASSESSMENT AND PLAN / ED COURSE  I reviewed the triage vital signs and the nursing notes.                              Differential diagnosis includes, but is not limited to, acute appendicitis, diverticulitis, urinary tract infection/pyelonephritis, bowel obstruction, colitis, renal colic, gastroenteritis, hernia, etc.   Patient's presentation is most consistent with acute complicated illness / injury requiring diagnostic workup.  Patient's diagnosis is consistent with chronic constipation, and concern for rectal abscess.  With reassuring exam and workup at this time.  Plain film  x-ray of abdomen reveals a moderate stool burden without evidence of SBO or fecal impaction.  Physical exam does not reveal any pilonidal cyst abscess, rectal abscess, perirectal abscess.  Patient reassured overall by her workup at this time.  Patient will be discharged home with prescriptions for lactulose  and famotidine . Patient is to follow up with PCP or GI specialist as discussed, as needed or otherwise directed. Patient is given ED precautions to return to the ED for any worsening or new symptoms.  FINAL CLINICAL IMPRESSION(S) / ED DIAGNOSES   Final diagnoses:  Chronic idiopathic constipation     Rx / DC Orders   ED Discharge Orders          Ordered    lactulose  (CHRONULAC ) 10 GM/15ML solution  2 times daily PRN        10/08/23 1812    famotidine  (PEPCID ) 20 MG tablet  2 times daily        10/08/23 1812             Note:  This document was prepared using Dragon voice recognition software and may include unintentional dictation errors.    Loyd Candida LULLA Aldona, PA-C 10/09/23 2114    Willo Dunnings, MD 10/09/23 [phone]

## 2023-10-13 ENCOUNTER — Ambulatory Visit: Payer: 59 | Admitting: Podiatry

## 2024-03-26 ENCOUNTER — Other Ambulatory Visit: Payer: Self-pay

## 2024-03-26 ENCOUNTER — Emergency Department
Admission: EM | Admit: 2024-03-26 | Discharge: 2024-03-26 | Disposition: A | Discharge status: Home / Self Care | Attending: Emergency Medicine | Admitting: Emergency Medicine

## 2024-03-26 DIAGNOSIS — I10 Essential (primary) hypertension: Secondary | ICD-10-CM | POA: Insufficient documentation

## 2024-03-26 LAB — COMPREHENSIVE METABOLIC PANEL WITH GFR
ALT: 18 U/L (ref 0–44)
AST: 28 U/L (ref 15–41)
Albumin: 4.3 g/dL (ref 3.5–5.0)
Alkaline Phosphatase: 67 U/L (ref 38–126)
Anion gap: 10 (ref 5–15)
BUN: 25 mg/dL — ABNORMAL HIGH (ref 6–20)
CO2: 20 mmol/L — ABNORMAL LOW (ref 22–32)
Calcium: 9.3 mg/dL (ref 8.9–10.3)
Chloride: 106 mmol/L (ref 98–111)
Creatinine, Ser: 0.79 mg/dL (ref 0.44–1.00)
GFR, Estimated: >60 mL/min (ref >60)
Glucose, Bld: 120 mg/dL — ABNORMAL HIGH (ref 70–99)
Potassium: 3.8 mmol/L (ref 3.5–5.1)
Sodium: 136 mmol/L (ref 135–145)
Total Bilirubin: 0.7 mg/dL (ref 0.0–1.2)
Total Protein: 7.7 g/dL (ref 6.5–8.1)

## 2024-03-26 LAB — CBC WITH DIFFERENTIAL/PLATELET
Abs Immature Granulocytes: 0.02 10*3/uL (ref 0.00–0.07)
Basophils Absolute: 0.1 10*3/uL (ref 0.0–0.1)
Basophils Relative: 1 %
Eosinophils Absolute: 0.1 10*3/uL (ref 0.0–0.5)
Eosinophils Relative: 2 %
HCT: 41.2 % (ref 36.0–46.0)
Hemoglobin: 14.2 g/dL (ref 12.0–15.0)
Immature Granulocytes: 0 %
Lymphocytes Relative: 30 %
Lymphs Abs: 2.3 10*3/uL (ref 0.7–4.0)
MCH: 32.9 pg (ref 26.0–34.0)
MCHC: 34.5 g/dL (ref 30.0–36.0)
MCV: 95.6 fL (ref 80.0–100.0)
Monocytes Absolute: 0.5 10*3/uL (ref 0.1–1.0)
Monocytes Relative: 7 %
Neutro Abs: 4.5 10*3/uL (ref 1.7–7.7)
Neutrophils Relative %: 60 %
Platelets: 233 10*3/uL (ref 150–400)
RBC: 4.31 MIL/uL (ref 3.87–5.11)
RDW: 13.4 % (ref 11.5–15.5)
WBC: 7.5 10*3/uL (ref 4.0–10.5)
nRBC: 0 % (ref 0.0–0.2)

## 2024-03-26 MED ORDER — AMLODIPINE BESYLATE 5 MG PO TABS
5.0000 mg | ORAL_TABLET | Freq: Once | ORAL | Status: AC
  Administered 2024-03-26: 5 mg via ORAL
  Filled 2024-03-26: qty 1

## 2024-03-26 MED ORDER — AMLODIPINE BESYLATE 5 MG PO TABS: 5.0000 mg | ORAL_TABLET | Freq: Every day | ORAL | 2 refills | Status: DC

## 2024-03-26 NOTE — ED Triage Notes (Addendum)
 Pt to ed from urgent care for HTN. Pt states I havent felt good the last few dasy so I went there and they checked my BP and it was over 200 so they sent me here. Pt is caox4, in no acute distress. Pt has been taking the wygovvy shot for the last 5-6 months and is curious if that is what's causing her to feel bad.

## 2024-03-26 NOTE — ED Provider Notes (Signed)
 [facility] Provider Note    Event Date/Time   First MD Initiated Contact with Patient 03/26/24 1524     (approximate)   History   Chief Complaint: Hypertension   HPI  Yvette Barr is a 63 y.o. female with a history of depression and obesity managed with [facility] who comes ED complaining of malaise for the past 2 days.  Denies any significant pain, no chest pain shortness of breath headache or vision changes.  No trauma, no vomiting diarrhea or sick contacts.  No fever.  Reports that she had her blood pressure checked and it was over 200 so she was instructed to come to the ED.  She reports having elevated blood pressures in the past but has never needed to take blood pressure medicine.        Past Medical History:  Diagnosis Date   Ischemic colitis (HCC)    Moderate single current episode of major depressive disorder (HCC) 11/01/2019    Current Outpatient Rx   Order #: [id number] Class: Normal   Order #: [id number] Class: Normal   Order #: [id number] Class: Normal   Order #: [id number] Class: Normal   Order #: [id number] Class: Normal    Past Surgical History:  Procedure Laterality Date   COLONOSCOPY     COLONOSCOPY WITH PROPOFOL  N/A 03/18/2021   Procedure: COLONOSCOPY WITH PROPOFOL ;  Surgeon: Jinny Carmine, MD;  Location: [facility];  Service: Endoscopy;  Laterality: N/A;   ESOPHAGOGASTRODUODENOSCOPY (EGD) WITH PROPOFOL  N/A 03/18/2021   Procedure: ESOPHAGOGASTRODUODENOSCOPY (EGD) WITH PROPOFOL ;  Surgeon: Jinny Carmine, MD;  Location: [facility];  Service: Endoscopy;  Laterality: N/A;   POLYPECTOMY N/A 03/18/2021   Procedure: POLYPECTOMY;  Surgeon: Jinny Carmine, MD;  Location: [facility];  Service: Endoscopy;  Laterality: N/A;   SIGMOIDOSCOPY     TOTAL HIP ARTHROPLASTY Right 10/26/2017   Procedure: TOTAL HIP ARTHROPLASTY;  Surgeon: Mardee Lynwood SQUIBB, MD;  Location: [facility] ORS;  Service: Orthopedics;  Laterality: Right;    Physical Exam    Triage Vital Signs: ED Triage Vitals  Encounter Vitals Group     BP 03/26/24 1512 (!) 186/95     Girls Systolic BP Percentile --      Girls Diastolic BP Percentile --      Boys Systolic BP Percentile --      Boys Diastolic BP Percentile --      Pulse Rate 03/26/24 1512 93     Resp 03/26/24 1512 16     Temp 03/26/24 1512 98 F (36.7 C)     Temp Source 03/26/24 1512 Oral     SpO2 03/26/24 1512 98 %     Weight --      Height 03/26/24 1513 5' 5 (1.651 m)     Head Circumference --      Peak Flow --      Pain Score 03/26/24 1511 0     Pain Loc --      Pain Education --      Exclude from Growth Chart --     Most recent vital signs: Vitals:   03/26/24 1512  BP: (!) 186/95  Pulse: 93  Resp: 16  Temp: 98 F (36.7 C)  SpO2: 98%    General: Awake, no distress.  CV:  Good peripheral perfusion.  Regular rate rhythm.  Normal distal pulses Resp:  Normal effort.  Clear to auscultation Abd:  No distention.  Soft nontender Other:  Moist oral mucosa.  Cranial nerves III through XII intact.  No lateralizing deficits   ED Results / Procedures / Treatments   Labs (all labs ordered are listed, but only abnormal results are displayed) Labs Reviewed  COMPREHENSIVE METABOLIC PANEL WITH GFR - Abnormal; Notable for the following components:      Result Value   CO2 20 (*)    Glucose, Bld 120 (*)    BUN 25 (*)    All other components within normal limits  CBC WITH DIFFERENTIAL/PLATELET     EKG    RADIOLOGY    PROCEDURES:  Procedures   MEDICATIONS ORDERED IN ED: Medications  amLODipine (NORVASC) tablet 5 mg (has no administration in time range)     IMPRESSION / MDM / ASSESSMENT AND PLAN / ED COURSE  I reviewed the triage vital signs and the nursing notes.  DDx: Uncontrolled essential hypertension, AKI, electrolyte derangement, viral illness.  Doubt hyperthyroidism, ACS PE dissection stroke intracranial hemorrhage or sepsis  Patient's presentation is most  consistent with acute presentation with potential threat to life or bodily function.  Patient comes to the ED complaining of malaise for the past few days, found to have uncontrolled hypertension with blood pressure here of 186/95.  She is nontoxic, no worrisome symptoms, neuro intact.  Will start amlodipine, follow-up PCP.       FINAL CLINICAL IMPRESSION(S) / ED DIAGNOSES   Final diagnoses:  Uncontrolled hypertension     Rx / DC Orders   ED Discharge Orders          Ordered    amLODipine (NORVASC) 5 MG tablet  Daily        03/26/24 1552             Note:  This document was prepared using Dragon voice recognition software and may include unintentional dictation errors.   Viviann Pastor, MD 03/26/24 [phone]

## 2024-03-26 NOTE — Discharge Instructions (Signed)

## 2024-03-27 ENCOUNTER — Other Ambulatory Visit: Payer: Self-pay | Admitting: Internal Medicine

## 2024-03-27 NOTE — Progress Notes (Unsigned)
 Date:  03/27/2024   Name:  Yvette Barr   DOB:  12/03/1960   MRN:  [id number]   Chief Complaint: No chief complaint on file.  HPI  Review of Systems   Lab Results  Component Value Date   NA 136 03/26/2024   K 3.8 03/26/2024   CO2 20 (L) 03/26/2024   GLUCOSE 120 (H) 03/26/2024   BUN 25 (H) 03/26/2024   CREATININE 0.79 03/26/2024   CALCIUM 9.3 03/26/2024   EGFR 75 01/14/2021   GFRNONAA >60 03/26/2024   Lab Results  Component Value Date   CHOL 171 01/14/2021   HDL 67 01/14/2021   LDLCALC 91 01/14/2021   TRIG 67 01/14/2021   CHOLHDL 2.6 01/14/2021   Lab Results  Component Value Date   TSH 2.080 01/14/2021   No results found for: HGBA1C Lab Results  Component Value Date   WBC 7.5 03/26/2024   HGB 14.2 03/26/2024   HCT 41.2 03/26/2024   MCV 95.6 03/26/2024   PLT 233 03/26/2024   Lab Results  Component Value Date   ALT 18 03/26/2024   AST 28 03/26/2024   ALKPHOS 67 03/26/2024   BILITOT 0.7 03/26/2024   No results found for: MARIEN BOLLS, VD25OH   Patient Active Problem List   Diagnosis Date Noted   Easy bruising 04/16/2022   BMI 27.0-27.9,adult 04/16/2022   Sessile serrated polyp of colon    Hx of gastritis    HNP (herniated nucleus pulposus), lumbar 11/27/2019   S/P hip replacement, right 08/08/2017   Constipation 07/26/2015   Tobacco use disorder 07/26/2015   Insomnia 07/26/2015   Underweight 07/26/2015    Allergies  Allergen Reactions   Codeine Other (See Comments)    Family history of codeine allergy with rash   Morphine And Codeine     Family history of morphine reaction with rash   Morphine Nausea Only    Decreases BP    Past Surgical History:  Procedure Laterality Date   COLONOSCOPY     COLONOSCOPY WITH PROPOFOL  N/A 03/18/2021   Procedure: COLONOSCOPY WITH PROPOFOL ;  Surgeon: Jinny Carmine, MD;  Location[facility];  Service: Endoscopy;  Laterality: N/A;   ESOPHAGOGASTRODUODENOSCOPY (EGD) WITH PROPOFOL  N/A  03/18/2021   Procedure: ESOPHAGOGASTRODUODENOSCOPY (EGD) WITH PROPOFOL ;  Surgeon: Jinny Carmine, MD;  Location: [facility];  Service: Endoscopy;  Laterality: N/A;   POLYPECTOMY N/A 03/18/2021   Procedure: POLYPECTOMY;  Surgeon: Jinny Carmine, MD;  Location: [facility];  Service: Endoscopy;  Laterality: N/A;   SIGMOIDOSCOPY     TOTAL HIP ARTHROPLASTY Right 10/26/2017   Procedure: TOTAL HIP ARTHROPLASTY;  Surgeon: Mardee Lynwood SQUIBB, MD;  Location: [facility] ORS;  Service: Orthopedics;  Laterality: Right;    Social History   Tobacco Use   Smoking status: Every Day    Current packs/day: 0.50    Average packs/day: 0.5 packs/day for 25.0 years (12.5 ttl pk-yrs)    Types: Cigarettes   Smokeless tobacco: Never   Tobacco comments:       Vaping Use   Vaping status: Never Used  Substance Use Topics   Alcohol use: Yes    Alcohol/week: 2.0 standard drinks of alcohol    Types: 2 Cans of beer per week    Comment: occasional   Drug use: No     Medication list has been reviewed and updated.  No outpatient medications have been marked as taking for the 03/27/24 encounter (Orders Only) with Justus Leita DEL, MD.  04/16/2022    3:53 PM 07/18/2021   11:18 AM 01/14/2021    7:57 AM 04/24/2020    3:33 PM  GAD 7 : Generalized Anxiety Score  Nervous, Anxious, on Edge 3 0 1 0  Control/stop worrying 3 0 1 0  Worry too much - different things 2 0 1 0  Trouble relaxing 2 0 0 0  Restless 2 0 0 0  Easily annoyed or irritable 3 0 0 0  Afraid - awful might happen 2 0 0 0  Total GAD 7 Score 17 0 3 0  Anxiety Difficulty Somewhat difficult Not difficult at all  Not difficult at all       04/16/2022    3:52 PM 07/18/2021   11:17 AM 01/14/2021    7:57 AM  Depression screen PHQ 2/9  Decreased Interest 0 0 0  Down, Depressed, Hopeless 3 0 1  PHQ - 2 Score 3 0 1  Altered sleeping 2 2 3   Tired, decreased energy 2 2 2   Change in appetite 3 2 0  Feeling bad or failure about yourself  1 0 0   Trouble concentrating 0 0 0  Moving slowly or fidgety/restless 0 0 0  Suicidal thoughts 0 0 0  PHQ-9 Score 11 6 6   Difficult doing work/chores Not difficult at all Not difficult at all     BP Readings from Last 3 Encounters:  03/26/24 (!) 164/88  10/08/23 (!) 167/98  04/16/22 124/88    Physical Exam  Wt Readings from Last 3 Encounters:  08/11/23 168 lb (76.2 kg)  04/16/22 168 lb (76.2 kg)  07/18/21 167 lb 9.6 oz (76 kg)    There were no vitals taken for this visit.  Assessment and Plan:  Problem List Items Addressed This Visit   None   No follow-ups on file.    Leita HILARIO Adie, MD [facility] [place]

## 2024-03-28 ENCOUNTER — Encounter: Payer: Self-pay | Admitting: Internal Medicine

## 2024-03-28 ENCOUNTER — Telehealth: Payer: Self-pay

## 2024-03-28 ENCOUNTER — Ambulatory Visit: Payer: Self-pay

## 2024-03-28 ENCOUNTER — Ambulatory Visit: Admitting: Internal Medicine

## 2024-03-28 VITALS — BP 145/80 | HR 82 | Ht 65.0 in | Wt 137.0 lb

## 2024-03-28 DIAGNOSIS — R636 Underweight: Secondary | ICD-10-CM | POA: Diagnosis not present

## 2024-03-28 DIAGNOSIS — F17200 Nicotine dependence, unspecified, uncomplicated: Secondary | ICD-10-CM

## 2024-03-28 DIAGNOSIS — I10 Essential (primary) hypertension: Secondary | ICD-10-CM | POA: Diagnosis not present

## 2024-03-28 MED ORDER — VARENICLINE TARTRATE (STARTER) 0.5 MG X 11 & 1 MG X 42 PO TBPK: ORAL_TABLET | ORAL | 0 refills | Status: AC

## 2024-03-28 NOTE — Assessment & Plan Note (Signed)
 Encouraged attempts to quit Rx for Chantix is given

## 2024-03-28 NOTE — Assessment & Plan Note (Addendum)
 New onset hypertension - seen in UC and ED Started on amlodipine 5 mg by ED - has only taken 3 doses. BP is slightly improved; she had itching yesterday but no rash - UC gave her prednisone  x 5 days Will continue amlodipine with OV in one month.

## 2024-03-28 NOTE — Assessment & Plan Note (Signed)
 She has lost 30 lbs recently with strict diet changes Will continue to monitor Discuss cancer screenings next visit

## 2024-03-28 NOTE — Telephone Encounter (Signed)
 Noted  Pt has appt  KP

## 2024-03-28 NOTE — Telephone Encounter (Signed)
 FYI Only or Action Required?: FYI only for provider.  Patient was last seen in primary care on 04/16/2022 by Justus Leita DEL, MD. Called Nurse Triage reporting Hypertension. Symptoms began several days ago. Interventions attempted: Prescription medications: Norvasc given by ED. Symptoms are: gradually worsening.  Triage Disposition: See Physician Within 24 Hours  Patient/caregiver understands and will follow disposition?: Yes    Copied from CRM [id number]. Topic: Clinical - Red Word Triage >> Mar 28, 2024  8:04 AM Antwanette L wrote: Red Word that prompted transfer to Nurse Triage: Bp 157/103 Reason for Disposition  Systolic BP  >= 180 OR Diastolic >= 110  Answer Assessment - Initial Assessment Questions Patient was not able to talk due to being at work and having to quickly make an appt. Patient was at the Ed on Saturday and given Norvasc for her blood pressure and was told to follow up with PCP asap   1. BLOOD PRESSURE: What is the blood pressure? Did you take at least two measurements 5 minutes apart?     Patient's BP was in the 200's on Saturday and is now 157/103 2. ONSET: When did you take your blood pressure?     At ED 3. HOW: How did you take your blood pressure? (e.g., automatic home BP monitor, visiting nurse)     Saturday at ED - also went to UC yesterday 4. HISTORY: Do you have a history of high blood pressure?     Unknown - pt wasn't able to be triaged due to quick call at work 5. MEDICINES: Are you taking any medicines for blood pressure? Have you missed any doses recently?     Patient was prescribed Norvasc at ED  6. OTHER SYMPTOMS: Do you have any symptoms? (e.g., blurred vision, chest pain, difficulty breathing, headache, weakness)     unknown  Protocols used: Blood Pressure - High-A-AH

## 2024-03-28 NOTE — Patient Instructions (Signed)
 Goal blood pressure is less than 130 over less then 80.

## 2024-03-28 NOTE — Progress Notes (Signed)
 Date:  03/28/2024   Name:  Yvette Barr   DOB:  1961/05/13   MRN:  [id number]   Chief Complaint: Hypertension (Pt smoked in the last hour. BP is high. Yesterday 158/105)  Hypertension This is a new problem. The problem has been gradually improving since onset. The problem is uncontrolled. Associated symptoms include headaches (improved). Pertinent negatives include no chest pain, palpitations or shortness of breath. Risk factors for coronary artery disease include smoking/tobacco exposure and stress. Past treatments include calcium channel blockers (just started amlodipine).    Review of Systems  Constitutional:  Positive for unexpected weight change (30 lb loss with diet changes). Negative for chills, fatigue and fever.  Respiratory:  Negative for cough, chest tightness, shortness of breath and wheezing.   Cardiovascular:  Negative for chest pain, palpitations and leg swelling.  Musculoskeletal:  Negative for arthralgias and gait problem.  Neurological:  Positive for headaches (improved). Negative for dizziness.  Psychiatric/Behavioral:  Negative for dysphoric mood and sleep disturbance. The patient is nervous/anxious.      Lab Results  Component Value Date   NA 136 03/26/2024   K 3.8 03/26/2024   CO2 20 (L) 03/26/2024   GLUCOSE 120 (H) 03/26/2024   BUN 25 (H) 03/26/2024   CREATININE 0.79 03/26/2024   CALCIUM 9.3 03/26/2024   EGFR 75 01/14/2021   GFRNONAA >60 03/26/2024   Lab Results  Component Value Date   CHOL 171 01/14/2021   HDL 67 01/14/2021   LDLCALC 91 01/14/2021   TRIG 67 01/14/2021   CHOLHDL 2.6 01/14/2021   Lab Results  Component Value Date   TSH 2.080 01/14/2021   No results found for: HGBA1C Lab Results  Component Value Date   WBC 7.5 03/26/2024   HGB 14.2 03/26/2024   HCT 41.2 03/26/2024   MCV 95.6 03/26/2024   PLT 233 03/26/2024   Lab Results  Component Value Date   ALT 18 03/26/2024   AST 28 03/26/2024   ALKPHOS 67 03/26/2024   BILITOT  0.7 03/26/2024   No results found for: MARIEN BOLLS, VD25OH   Patient Active Problem List   Diagnosis Date Noted   Essential hypertension 03/28/2024   Easy bruising 04/16/2022   BMI 27.0-27.9,adult 04/16/2022   Sessile serrated polyp of colon    Hx of gastritis    HNP (herniated nucleus pulposus), lumbar 11/27/2019   S/P hip replacement, right 08/08/2017   Constipation 07/26/2015   Tobacco use disorder 07/26/2015   Insomnia 07/26/2015   Underweight 07/26/2015    Allergies  Allergen Reactions   Codeine Other (See Comments)    Family history of codeine allergy with rash   Morphine And Codeine     Family history of morphine reaction with rash   Morphine Nausea Only    Decreases BP    Past Surgical History:  Procedure Laterality Date   COLONOSCOPY     COLONOSCOPY WITH PROPOFOL  N/A 03/18/2021   Procedure: COLONOSCOPY WITH PROPOFOL ;  Surgeon: Jinny Carmine, MD;  Location: [facility];  Service: Endoscopy;  Laterality: N/A;   ESOPHAGOGASTRODUODENOSCOPY (EGD) WITH PROPOFOL  N/A 03/18/2021   Procedure: ESOPHAGOGASTRODUODENOSCOPY (EGD) WITH PROPOFOL ;  Surgeon: Jinny Carmine, MD;  Location: [facility];  Service: Endoscopy;  Laterality: N/A;   POLYPECTOMY N/A 03/18/2021   Procedure: POLYPECTOMY;  Surgeon: Jinny Carmine, MD;  Location: [facility];  Service: Endoscopy;  Laterality: N/A;   SIGMOIDOSCOPY     TOTAL HIP ARTHROPLASTY Right 10/26/2017   Procedure: TOTAL HIP ARTHROPLASTY;  Surgeon:  Hooten, Lynwood SQUIBB, MD;  Location: [facility] OR[facility];  Service: Orthopedics;  Laterality: Right;    Social History   Tobacco Use   Smoking status: Every Day    Current packs/day: 0.50    Average packs/day: 0.5 packs/day for 41.5 years (20.7 ttl pk-yrs)    Types: Cigarettes    Start date: 1984   Smokeless tobacco: Never   Tobacco comments:       Vaping Use   Vaping status: Never Used  Substance Use Topics   Alcohol use: Yes    Alcohol/week: 2.0 standard drinks of  alcohol    Types: 2 Cans of beer per week    Comment: occasional   Drug use: No     Medication list has been reviewed and updated.  Current Meds  Medication Sig   amLODipine (NORVASC) 5 MG tablet Take 1 tablet (5 mg total) by mouth daily.   famotidine  (PEPCID ) 20 MG tablet Take 1 tablet (20 mg total) by mouth 2 (two) times daily.   lactulose  (CHRONULAC ) 10 GM/15ML solution Take 30 mLs (20 g total) by mouth 2 (two) times daily as needed for mild constipation.   traMADol  (ULTRAM ) 50 MG tablet Take 50 mg by mouth every 6 (six) hours as needed.   Varenicline Tartrate, Starter, (CHANTIX STARTING MONTH PAK) 0.5 MG X 11 & 1 MG X 42 TBPK Take as directed       03/28/2024    3:20 PM 04/16/2022    3:53 PM 07/18/2021   11:18 AM 01/14/2021    7:57 AM  GAD 7 : Generalized Anxiety Score  Nervous, Anxious, on Edge 1 3 0 1  Control/stop worrying 1 3 0 1  Worry too much - different things 1 2 0 1  Trouble relaxing 1 2 0 0  Restless 1 2 0 0  Easily annoyed or irritable 1 3 0 0  Afraid - awful might happen 1 2 0 0  Total GAD 7 Score 7 17 0 3  Anxiety Difficulty Somewhat difficult Somewhat difficult Not difficult at all        03/28/2024    3:19 PM 04/16/2022    3:52 PM 07/18/2021   11:17 AM  Depression screen PHQ 2/9  Decreased Interest 0 0 0  Down, Depressed, Hopeless 1 3 0  PHQ - 2 Score 1 3 0  Altered sleeping 1 2 2   Tired, decreased energy 1 2 2   Change in appetite 0 3 2  Feeling bad or failure about yourself  0 1 0  Trouble concentrating 0 0 0  Moving slowly or fidgety/restless 0 0 0  Suicidal thoughts 0 0 0  PHQ-9 Score 3 11 6   Difficult doing work/chores Not difficult at all Not difficult at all Not difficult at all    BP Readings from Last 3 Encounters:  03/28/24 (!) 145/80  03/26/24 (!) 164/88  10/08/23 (!) 167/98    Physical Exam Vitals and nursing note reviewed.  Constitutional:      General: She is not in acute distress.    Appearance: Normal appearance. She is  well-developed.  HENT:     Head: Normocephalic and atraumatic.  Neck:     Vascular: No carotid bruit.   Cardiovascular:     Rate and Rhythm: Normal rate and regular rhythm.     Heart sounds: No murmur heard. Pulmonary:     Effort: Pulmonary effort is normal. No respiratory distress.     Breath sounds: No wheezing or rhonchi.   Musculoskeletal:  Cervical back: Normal range of motion.     Right lower leg: No edema.     Left lower leg: No edema.  Lymphadenopathy:     Cervical: No cervical adenopathy.   Skin:    General: Skin is warm and dry.     Findings: No rash.   Neurological:     General: No focal deficit present.     Mental Status: She is alert and oriented to person, place, and time.   Psychiatric:        Mood and Affect: Mood normal.        Behavior: Behavior normal.     Wt Readings from Last 3 Encounters:  03/28/24 137 lb (62.1 kg)  08/11/23 168 lb (76.2 kg)  04/16/22 168 lb (76.2 kg)    BP (!) 145/80   Pulse 82   Ht 5' 5 (1.651 m)   Wt 137 lb (62.1 kg)   SpO2 98%   BMI 22.80 kg/m   Assessment and Plan:  Problem List Items Addressed This Visit       Unprioritized   Tobacco use disorder (Chronic)   Encouraged attempts to quit Rx for Chantix is given      Relevant Medications   Varenicline Tartrate, Starter, (CHANTIX STARTING MONTH PAK) 0.5 MG X 11 & 1 MG X 42 TBPK   Underweight   She has lost 30 lbs recently with strict diet changes Will continue to monitor Discuss cancer screenings next visit      Essential hypertension - Primary   New onset hypertension - seen in UC and ED Started on amlodipine 5 mg by ED - has only taken 3 doses. BP is slightly improved; she had itching yesterday but no rash - UC gave her prednisone  x 5 days Will continue amlodipine with OV in one month.       Return in about 1 month (around 04/27/2024) for HTN.    Leita HILARIO Adie, MD [facility] [place]

## 2024-03-28 NOTE — Transitions of Care (Post Inpatient/ED Visit) (Unsigned)
   03/28/2024  Name: Yvette Barr MRN: [id number] DOB: 12/22/1960  Today's TOC FU Call Status: Today's TOC FU Call Status:: Unsuccessful Call (1st Attempt) Unsuccessful Call (1st Attempt) Date: 03/28/24  Attempted to reach the patient regarding the most recent Inpatient/ED visit.  Follow Up Plan: Additional outreach attempts will be made to reach the patient to complete the Transitions of Care (Post Inpatient/ED visit) call.   Helio Lack   Primary Care & Sports Medicine at [facility] [place] CMA, AAMA [address]  [place] [address] Office [phone]  Fax: [phone]

## 2024-03-29 NOTE — Transitions of Care (Post Inpatient/ED Visit) (Signed)
   03/29/2024  Name: Yvette Barr MRN: [id number] DOB: May 10, 1961  Today's TOC FU Call Status: Today's TOC FU Call Status:: Successful TOC FU Call Completed Unsuccessful Call (1st Attempt) Date: 03/28/24 [facility] FU Call Complete Date: 03/29/24 Patient's Name and Date of Birth confirmed.  Transition Care Management Follow-up Telephone Call Date of Discharge: 03/26/24 Discharge Facility: [facility]) Type of Discharge: Emergency Department Reason for ED Visit: Other: How have you been since you were released from the hospital?: Better Any questions or concerns?: No  Items Reviewed: Did you receive and understand the discharge instructions provided?: No Medications obtained,verified, and reconciled?: Yes (Medications Reviewed) Any new allergies since your discharge?: No Dietary orders reviewed?: NA Do you have support at home?: Yes  Medications Reviewed Today: Medications Reviewed Today     Reviewed by Kamariyah Timberlake N, CMA (Certified Medical Assistant) on 03/29/24 at 1646  Med List Status: <None>   Medication Order Taking? Sig Documenting Provider Last Dose Status Informant  amLODipine (NORVASC) 5 MG tablet [id number] Yes Take 1 tablet (5 mg total) by mouth daily. Viviann Pastor, MD  Active   famotidine  (PEPCID ) 20 MG tablet [id number] Yes Take 1 tablet (20 mg total) by mouth 2 (two) times daily. Menshew, Candida LULLA Kings, PA-C  Active   lactulose  (CHRONULAC ) 10 GM/15ML solution [id number] Yes Take 30 mLs (20 g total) by mouth 2 (two) times daily as needed for mild constipation. Menshew, Candida LULLA Kings, PA-C  Active   Varenicline Tartrate, Starter, (CHANTIX STARTING MONTH PAK) 0.5 MG X 11 & 1 MG X 42 TBPK [id number] Yes Take as directed Justus Leita DEL, MD  Active             Home Care and Equipment/Supplies: Were Home Health Services Ordered?: NA Any new equipment or medical supplies ordered?: NA  Functional Questionnaire: Do you need assistance with  bathing/showering or dressing?: No Do you need assistance with meal preparation?: No Do you need assistance with eating?: No Do you have difficulty maintaining continence: No Do you need assistance with getting out of bed/getting out of a chair/moving?: No Do you have difficulty managing or taking your medications?: No  Follow up appointments reviewed: PCP Follow-up appointment confirmed?: Yes Date of PCP follow-up appointment?: 03/28/24 Specialist Hospital Follow-up appointment confirmed?: NA Do you need transportation to your follow-up appointment?: No Do you understand care options if your condition(s) worsen?: Yes-patient verbalized understanding   Britainy Kozub Marblemount  Primary Care & Sports Medicine at [facility] CMA, AAMA [address]  [place] [address] Office [phone]  Fax: [phone]

## 2024-03-30 ENCOUNTER — Ambulatory Visit: Payer: Self-pay

## 2024-03-30 NOTE — Telephone Encounter (Signed)
   Patient disconnected phone call before transfer.  Attempted to reach patient. LVM for patient to return call to [phone]. Call routed to PCP office     Copied from CRM [id number]. Topic: Clinical - Red Word Triage >> Mar 30, 2024  5:41 PM Winona R wrote: Pt calling with high blood pressure, anxiety and trouble sleeping. Pt states she use to take aleve  PM but its not working now.

## 2024-05-17 ENCOUNTER — Encounter: Payer: Self-pay | Admitting: Internal Medicine

## 2024-05-17 ENCOUNTER — Ambulatory Visit (INDEPENDENT_AMBULATORY_CARE_PROVIDER_SITE_OTHER): Admitting: Internal Medicine

## 2024-05-17 VITALS — BP 126/84 | HR 71 | Ht 65.0 in | Wt 136.0 lb

## 2024-05-17 DIAGNOSIS — F17200 Nicotine dependence, unspecified, uncomplicated: Secondary | ICD-10-CM

## 2024-05-17 DIAGNOSIS — F411 Generalized anxiety disorder: Secondary | ICD-10-CM | POA: Diagnosis not present

## 2024-05-17 DIAGNOSIS — I10 Essential (primary) hypertension: Secondary | ICD-10-CM

## 2024-05-17 DIAGNOSIS — Z1231 Encounter for screening mammogram for malignant neoplasm of breast: Secondary | ICD-10-CM | POA: Diagnosis not present

## 2024-05-17 MED ORDER — AMLODIPINE BESYLATE 5 MG PO TABS: 5.0000 mg | ORAL_TABLET | Freq: Every day | ORAL | 1 refills | Status: DC

## 2024-05-17 MED ORDER — FLUOXETINE HCL 10 MG PO CAPS: 10.0000 mg | ORAL_CAPSULE | Freq: Every day | ORAL | 0 refills | Status: DC

## 2024-05-17 MED ORDER — VARENICLINE TARTRATE 1 MG PO TABS: 1.0000 mg | ORAL_TABLET | Freq: Two times a day (BID) | ORAL | 2 refills | Status: AC

## 2024-05-17 NOTE — Progress Notes (Signed)
 Date:  05/17/2024   Name:  Yvette Barr   DOB:  May 22, 1961   MRN:  [id number]   Chief Complaint: Hypertension and Nicotine Dependence (Wants to go up a dosage on chantix )  Hypertension This is a chronic problem. The problem has been gradually improving since onset. Associated symptoms include anxiety. Pertinent negatives include no chest pain, headaches, palpitations or shortness of breath. Past treatments include calcium channel blockers.  Anxiety Presents for follow-up visit. Symptoms include compulsions, irritability, nervous/anxious behavior and restlessness. Patient reports no chest pain, dizziness, excessive worry, hyperventilation, palpitations, shortness of breath or suicidal ideas. Symptoms occur most days. The severity of symptoms is moderate.   Compliance with medications: she has taken Lexapro  in the past but does not recall response or side effects.    Review of Systems  Constitutional:  Positive for irritability. Negative for fatigue and unexpected weight change.  HENT:  Negative for trouble swallowing.   Eyes:  Negative for visual disturbance.  Respiratory:  Negative for cough, chest tightness, shortness of breath and wheezing.   Cardiovascular:  Negative for chest pain, palpitations and leg swelling.  Gastrointestinal:  Negative for abdominal pain, constipation and diarrhea.  Musculoskeletal:  Negative for arthralgias and myalgias.  Neurological:  Negative for dizziness, weakness, light-headedness and headaches.  Psychiatric/Behavioral:  Negative for dysphoric mood, self-injury and suicidal ideas. The patient is nervous/anxious.      Lab Results  Component Value Date   NA 136 03/26/2024   K 3.8 03/26/2024   CO2 20 (L) 03/26/2024   GLUCOSE 120 (H) 03/26/2024   BUN 25 (H) 03/26/2024   CREATININE 0.79 03/26/2024   CALCIUM 9.3 03/26/2024   EGFR 75 01/14/2021   GFRNONAA >60 03/26/2024   Lab Results  Component Value Date   CHOL 171 01/14/2021   HDL 67 01/14/2021    LDLCALC 91 01/14/2021   TRIG 67 01/14/2021   CHOLHDL 2.6 01/14/2021   Lab Results  Component Value Date   TSH 2.080 01/14/2021   No results found for: HGBA1C Lab Results  Component Value Date   WBC 7.5 03/26/2024   HGB 14.2 03/26/2024   HCT 41.2 03/26/2024   MCV 95.6 03/26/2024   PLT 233 03/26/2024   Lab Results  Component Value Date   ALT 18 03/26/2024   AST 28 03/26/2024   ALKPHOS 67 03/26/2024   BILITOT 0.7 03/26/2024   No results found for: MARIEN BOLLS, VD25OH   Patient Active Problem List   Diagnosis Date Noted   Generalized anxiety disorder 05/17/2024   Essential hypertension 03/28/2024   Easy bruising 04/16/2022   BMI 27.0-27.9,adult 04/16/2022   Sessile serrated polyp of colon    Hx of gastritis    HNP (herniated nucleus pulposus), lumbar 11/27/2019   S/P hip replacement, right 08/08/2017   Constipation 07/26/2015   Tobacco use disorder 07/26/2015   Insomnia 07/26/2015   Underweight 07/26/2015    Allergies  Allergen Reactions   Codeine Other (See Comments)    Family history of codeine allergy with rash  codeine   Morphine And Codeine     Family history of morphine reaction with rash   Morphine Nausea Only    Decreases BP  morphine    Past Surgical History:  Procedure Laterality Date   COLONOSCOPY     COLONOSCOPY WITH PROPOFOL  N/A 03/18/2021   Procedure: COLONOSCOPY WITH PROPOFOL ;  Surgeon: Jinny Carmine, MD;  Location: [facility];  Service: Endoscopy;  Laterality: N/A;   ESOPHAGOGASTRODUODENOSCOPY (EGD) WITH PROPOFOL   N/A 03/18/2021   Procedure: ESOPHAGOGASTRODUODENOSCOPY (EGD) WITH PROPOFOL ;  Surgeon: Jinny Carmine, MD;  Location: [facility];  Service: Endoscopy;  Laterality: N/A;   POLYPECTOMY N/A 03/18/2021   Procedure: POLYPECTOMY;  Surgeon: Jinny Carmine, MD;  Location: [facility];  Service: Endoscopy;  Laterality: N/A;   SIGMOIDOSCOPY     TOTAL HIP ARTHROPLASTY Right 10/26/2017   Procedure: TOTAL  HIP ARTHROPLASTY;  Surgeon: Mardee Lynwood SQUIBB, MD;  Location: [facility] OR[facility];  Service: Orthopedics;  Laterality: Right;    Social History   Tobacco Use   Smoking status: Every Day    Current packs/day: 0.50    Average packs/day: 0.5 packs/day for 41.6 years (20.8 ttl pk-yrs)    Types: Cigarettes    Start date: 1984   Smokeless tobacco: Never   Tobacco comments:       Vaping Use   Vaping status: Never Used  Substance Use Topics   Alcohol use: Yes    Alcohol/week: 2.0 standard drinks of alcohol    Types: 2 Cans of beer per week    Comment: occasional   Drug use: No     Medication list has been reviewed and updated.  Current Meds  Medication Sig   FLUoxetine  (PROZAC ) 10 MG capsule Take 1 capsule (10 mg total) by mouth daily.   varenicline  (CHANTIX  CONTINUING MONTH PAK) 1 MG tablet Take 1 tablet (1 mg total) by mouth 2 (two) times daily.   [DISCONTINUED] amLODipine  (NORVASC ) 5 MG tablet Take 1 tablet (5 mg total) by mouth daily.   [DISCONTINUED] famotidine  (PEPCID ) 20 MG tablet Take 1 tablet (20 mg total) by mouth 2 (two) times daily.   [DISCONTINUED] lactulose  (CHRONULAC ) 10 GM/15ML solution Take 30 mLs (20 g total) by mouth 2 (two) times daily as needed for mild constipation.       05/17/2024    4:09 PM 03/28/2024    3:20 PM 04/16/2022    3:53 PM 07/18/2021   11:18 AM  GAD 7 : Generalized Anxiety Score  Nervous, Anxious, on Edge 1 1 3  0  Control/stop worrying 0 1 3 0  Worry too much - different things 0 1 2 0  Trouble relaxing 1 1 2  0  Restless 0 1 2 0  Easily annoyed or irritable 1 1 3  0  Afraid - awful might happen 0 1 2 0  Total GAD 7 Score 3 7 17  0  Anxiety Difficulty Not difficult at all Somewhat difficult Somewhat difficult Not difficult at all       05/17/2024    4:09 PM 03/28/2024    3:19 PM 04/16/2022    3:52 PM  Depression screen PHQ 2/9  Decreased Interest 0 0 0  Down, Depressed, Hopeless 1 1 3   PHQ - 2 Score 1 1 3   Altered sleeping 1 1 2   Tired, decreased  energy 1 1 2   Change in appetite 0 0 3  Feeling bad or failure about yourself  0 0 1  Trouble concentrating 0 0 0  Moving slowly or fidgety/restless 0 0 0  Suicidal thoughts 0 0 0  PHQ-9 Score 3 3 11   Difficult doing work/chores Not difficult at all Not difficult at all Not difficult at all    BP Readings from Last 3 Encounters:  05/17/24 126/84  03/28/24 (!) 145/80  03/26/24 (!) 164/88    Physical Exam Vitals and nursing note reviewed.  Constitutional:      General: She is not in acute distress.    Appearance: She  is well-developed.  HENT:     Head: Normocephalic and atraumatic.  Cardiovascular:     Rate and Rhythm: Normal rate and regular rhythm.  Pulmonary:     Effort: Pulmonary effort is normal. No respiratory distress.     Breath sounds: No wheezing or rhonchi.  Musculoskeletal:     Cervical back: Normal range of motion.     Right lower leg: No edema.     Left lower leg: No edema.  Skin:    General: Skin is warm and dry.     Findings: No rash.  Neurological:     Mental Status: She is alert and oriented to person, place, and time.  Psychiatric:        Mood and Affect: Mood normal.        Behavior: Behavior normal.     Wt Readings from Last 3 Encounters:  05/17/24 136 lb (61.7 kg)  03/28/24 137 lb (62.1 kg)  08/11/23 168 lb (76.2 kg)    BP 126/84   Pulse 71   Ht 5' 5 (1.651 m)   Wt 136 lb (61.7 kg)   SpO2 98%   BMI 22.63 kg/m   Assessment and Plan:  Problem List Items Addressed This Visit       Unprioritized   Tobacco use disorder (Chronic)   She is cutting back significantly with chantix . Now ready for the full dose and refills. She declines LDCT screening      Relevant Medications   varenicline  (CHANTIX  CONTINUING MONTH PAK) 1 MG tablet   Essential hypertension - Primary   Blood pressure is well controlled on amlodipine  5 mg. No medication side effects noted. Plan to continue current medications.       Relevant Medications    amLODipine  (NORVASC ) 5 MG tablet   Generalized anxiety disorder   Slight worsening of previous symptoms She would like to try Prozac  at a low dose. Recommend follow up in 3 months      Relevant Medications   FLUoxetine  (PROZAC ) 10 MG capsule   Other Visit Diagnoses       Encounter for screening mammogram for breast cancer       schedule at [facility]   Relevant Orders   MM 3D SCREENING MAMMOGRAM BILATERAL BREAST       Return in about 3 months (around 08/17/2024) for HTN, Depression.    Leita HILARIO Adie, MD [facility] [place]

## 2024-05-17 NOTE — Assessment & Plan Note (Signed)
 She is cutting back significantly with chantix . Now ready for the full dose and refills. She declines LDCT screening

## 2024-05-17 NOTE — Assessment & Plan Note (Signed)
 Blood pressure is well controlled on amlodipine  5 mg. No medication side effects noted. Plan to continue current medications.

## 2024-05-17 NOTE — Assessment & Plan Note (Signed)
 Slight worsening of previous symptoms She would like to try Prozac  at a low dose. Recommend follow up in 3 months

## 2024-05-17 NOTE — Patient Instructions (Signed)
 Call [facility] to schedule your mammogram at [phone].   Tell them you want you to go to [place].

## 2024-08-15 ENCOUNTER — Ambulatory Visit: Payer: Self-pay

## 2024-08-15 ENCOUNTER — Telehealth: Payer: Self-pay

## 2024-08-15 NOTE — Telephone Encounter (Signed)
 Patient having urinary symptoms and states that she would like to come in tomorrow (08/16/2024 in the late afternoon)   No appointments available today at PCP office or surrounding offices within the region Patient did not want to go to Urgent Care  FYI Only or Action Required?: Action required by provider: request for appointment, clinical question for provider, and update on patient condition.  Patient was last seen in primary care on 05/17/2024 by Justus Leita DEL, MD.  Called Nurse Triage reporting Dysuria.  Symptoms began several days ago.  Interventions attempted: Rest, hydration, or home remedies.  Symptoms are: gradually worsening.  Triage Disposition: See HCP Within 4 Hours (Or PCP Triage)  Patient/caregiver understands and will follow disposition?: No, wishes to speak with PCP                  Copied from CRM #[id number]. Topic: Clinical - Medication Question >> Aug 15, 2024  3:17 PM Tiffini S wrote: Reason for CRM: Patient states that she has bladder infection- started over the weekend with lower back pain- urine is a dark red Reason for Disposition  Side (flank) or lower back pain present  Answer Assessment - Initial Assessment Questions Patient having urinary symptoms and states that she would like to come in tomorrow (08/16/2024 in the late afternoon)   No appointments available today at PCP office or surrounding offices within the region Patient did not want to go to Urgent Care & she does already have an appt to see her PCP on Wednesday (2 days from now) Patient is advised to call us  back if anything changes or with any further questions/concerns. Patient is advised that if anything worsens to go to the Emergency Room. Patient verbalized understanding.    1. SYMPTOM: What's the main symptom you're concerned about? (e.g., frequency, incontinence)     Odor in urine, frequency, lower back pain 2. ONSET: When did the odor, discomfort  start?     3-4  days ago 3. PAIN: Is there any pain? If Yes, ask: How bad is it? (Scale: 1-10; mild, moderate, severe)     5-6 4. CAUSE: What do you think is causing the symptoms?     Infection  Protocols used: Urinary Symptoms-A-AH

## 2024-08-15 NOTE — Telephone Encounter (Unsigned)
 Copied from CRM #[id number]. Topic: Clinical - Medical Advice >> Aug 15, 2024  4:40 PM Victoria B wrote: Reason for CRM: Patient called in about infection with urinary she says. She spoke with NT earlier today and has an appt Wednesday, but wants to know can she get an appt tomorrow instead

## 2024-08-16 ENCOUNTER — Encounter: Payer: Self-pay | Admitting: Internal Medicine

## 2024-08-16 ENCOUNTER — Ambulatory Visit (INDEPENDENT_AMBULATORY_CARE_PROVIDER_SITE_OTHER): Admitting: Internal Medicine

## 2024-08-16 VITALS — BP 140/82 | HR 75 | Ht 65.0 in | Wt 137.0 lb

## 2024-08-16 DIAGNOSIS — R8290 Unspecified abnormal findings in urine: Secondary | ICD-10-CM

## 2024-08-16 DIAGNOSIS — I10 Essential (primary) hypertension: Secondary | ICD-10-CM

## 2024-08-16 DIAGNOSIS — F17200 Nicotine dependence, unspecified, uncomplicated: Secondary | ICD-10-CM

## 2024-08-16 DIAGNOSIS — F411 Generalized anxiety disorder: Secondary | ICD-10-CM

## 2024-08-16 LAB — POCT URINALYSIS DIPSTICK
Bilirubin, UA: NEGATIVE
Blood, UA: NEGATIVE
Glucose, UA: NEGATIVE
Ketones, UA: NEGATIVE
Nitrite, UA: NEGATIVE
Protein, UA: NEGATIVE
Spec Grav, UA: 1.015 (ref 1.010–1.025)
Urobilinogen, UA: 0.2 U/dL
pH, UA: 5 (ref 5.0–8.0)

## 2024-08-16 MED ORDER — METOPROLOL SUCCINATE ER 25 MG PO TB24: 25.0000 mg | ORAL_TABLET | Freq: Every day | ORAL | 0 refills | Status: DC

## 2024-08-16 MED ORDER — ALPRAZOLAM 0.5 MG PO TABS: 0.5000 mg | ORAL_TABLET | Freq: Every evening | ORAL | 0 refills | Status: DC | PRN

## 2024-08-16 MED ORDER — ESCITALOPRAM OXALATE 10 MG PO TABS: 10.0000 mg | ORAL_TABLET | Freq: Every day | ORAL | 0 refills | Status: DC

## 2024-08-16 NOTE — Progress Notes (Signed)
 Date:  08/16/2024   Name:  Yvette Barr   DOB:  1961/04/20   MRN:  [id number]   Chief Complaint: Hypertension (Wants to try more medication. BP has been high with dull headache.), Depression, Anxiety (Wants to try Xanax for sleep and anxiety. ), and Urinary Tract Infection (Dysuria and foul smelling urine.)  Hypertension This is a chronic problem. The problem is controlled. Associated symptoms include headaches. Pertinent negatives include no chest pain, palpitations or shortness of breath. Past treatments include calcium channel blockers.  Depression        This is a chronic problem.The problem is unchanged.  Associated symptoms include insomnia, decreased interest and headaches.  Associated symptoms include no fatigue, no myalgias and no suicidal ideas.     The symptoms are aggravated by family issues (mother with dementia).  Past treatments include SSRIs - Selective serotonin reuptake inhibitors (did not try Prozac  for very long). Urinary Tract Infection  This is a new problem. The current episode started yesterday. The problem occurs every urination. The pain is moderate. There has been no fever. Associated symptoms include hematuria.    Review of Systems  Constitutional:  Negative for fatigue and unexpected weight change.  HENT:  Negative for trouble swallowing.   Eyes:  Negative for visual disturbance.  Respiratory:  Negative for cough, chest tightness, shortness of breath and wheezing.   Cardiovascular:  Negative for chest pain, palpitations and leg swelling.  Gastrointestinal:  Negative for abdominal pain, constipation and diarrhea.  Genitourinary:  Positive for hematuria.  Musculoskeletal:  Negative for arthralgias and myalgias.  Neurological:  Positive for headaches. Negative for dizziness, weakness and light-headedness.  Psychiatric/Behavioral:  Positive for depression, dysphoric mood and sleep disturbance. Negative for suicidal ideas. The patient is nervous/anxious and has  insomnia.      Lab Results  Component Value Date   NA 136 03/26/2024   K 3.8 03/26/2024   CO2 20 (L) 03/26/2024   GLUCOSE 120 (H) 03/26/2024   BUN 25 (H) 03/26/2024   CREATININE 0.79 03/26/2024   CALCIUM 9.3 03/26/2024   EGFR 75 01/14/2021   GFRNONAA >60 03/26/2024   Lab Results  Component Value Date   CHOL 171 01/14/2021   HDL 67 01/14/2021   LDLCALC 91 01/14/2021   TRIG 67 01/14/2021   CHOLHDL 2.6 01/14/2021   Lab Results  Component Value Date   TSH 2.080 01/14/2021   No results found for: HGBA1C Lab Results  Component Value Date   WBC 7.5 03/26/2024   HGB 14.2 03/26/2024   HCT 41.2 03/26/2024   MCV 95.6 03/26/2024   PLT 233 03/26/2024   Lab Results  Component Value Date   ALT 18 03/26/2024   AST 28 03/26/2024   ALKPHOS 67 03/26/2024   BILITOT 0.7 03/26/2024   No results found for: MARIEN BOLLS, VD25OH   Patient Active Problem List   Diagnosis Date Noted   Generalized anxiety disorder 05/17/2024   Essential hypertension 03/28/2024   Easy bruising 04/16/2022   BMI 27.0-27.9,adult 04/16/2022   Sessile serrated polyp of colon    Hx of gastritis    HNP (herniated nucleus pulposus), lumbar 11/27/2019   S/P hip replacement, right 08/08/2017   Constipation 07/26/2015   Tobacco use disorder 07/26/2015   Insomnia 07/26/2015   Underweight 07/26/2015    Allergies  Allergen Reactions   Codeine Other (See Comments)    Family history of codeine allergy with rash  codeine   Morphine And Codeine  Family history of morphine reaction with rash   Morphine Nausea Only    Decreases BP  morphine    Past Surgical History:  Procedure Laterality Date   COLONOSCOPY     COLONOSCOPY WITH PROPOFOL  N/A 03/18/2021   Procedure: COLONOSCOPY WITH PROPOFOL ;  Surgeon: Jinny Carmine, MD;  Location: [facility];  Service: Endoscopy;  Laterality: N/A;   ESOPHAGOGASTRODUODENOSCOPY (EGD) WITH PROPOFOL  N/A 03/18/2021   Procedure:  ESOPHAGOGASTRODUODENOSCOPY (EGD) WITH PROPOFOL ;  Surgeon: Jinny Carmine, MD;  Location: [facility];  Service: Endoscopy;  Laterality: N/A;   POLYPECTOMY N/A 03/18/2021   Procedure: POLYPECTOMY;  Surgeon: Jinny Carmine, MD;  Location: [facility];  Service: Endoscopy;  Laterality: N/A;   SIGMOIDOSCOPY     TOTAL HIP ARTHROPLASTY Right 10/26/2017   Procedure: TOTAL HIP ARTHROPLASTY;  Surgeon: Mardee Lynwood SQUIBB, MD;  Location: [facility];  Service: Orthopedics;  Laterality: Right;    Social History   Tobacco Use   Smoking status: Every Day    Current packs/day: 0.50    Average packs/day: 0.5 packs/day for 41.9 years (20.9 ttl pk-yrs)    Types: Cigarettes    Start date: 1984   Smokeless tobacco: Never   Tobacco comments:     Chantix  prescribed  Vaping Use   Vaping status: Never Used  Substance Use Topics   Alcohol use: Yes    Alcohol/week: 2.0 standard drinks of alcohol    Types: 2 Cans of beer per week    Comment: occasional   Drug use: No     Medication list has been reviewed and updated.  Current Meds  Medication Sig   ALPRAZolam (XANAX) 0.5 MG tablet Take 1 tablet (0.5 mg total) by mouth at bedtime as needed for anxiety.   amLODipine  (NORVASC ) 5 MG tablet Take 1 tablet (5 mg total) by mouth daily.   escitalopram  (LEXAPRO ) 10 MG tablet Take 1 tablet (10 mg total) by mouth daily.   metoprolol succinate (TOPROL-XL) 25 MG 24 hr tablet Take 1 tablet (25 mg total) by mouth daily.   varenicline  (CHANTIX  CONTINUING MONTH PAK) 1 MG tablet Take 1 tablet (1 mg total) by mouth 2 (two) times daily.   [DISCONTINUED] FLUoxetine  (PROZAC ) 10 MG capsule Take 1 capsule (10 mg total) by mouth daily.       08/16/2024    3:44 PM 05/17/2024    4:09 PM 03/28/2024    3:20 PM 04/16/2022    3:53 PM  GAD 7 : Generalized Anxiety Score  Nervous, Anxious, on Edge 3 1 1 3   Control/stop worrying 3 0 1 3  Worry too much - different things 3 0 1 2  Trouble relaxing 3 1 1 2   Restless 0 0 1 2   Easily annoyed or irritable 0 1 1 3   Afraid - awful might happen 0 0 1 2  Total GAD 7 Score 12 3 7 17   Anxiety Difficulty Not difficult at all Not difficult at all Somewhat difficult Somewhat difficult       08/16/2024    3:43 PM 05/17/2024    4:09 PM 03/28/2024    3:19 PM  Depression screen PHQ 2/9  Decreased Interest 0 0 0  Down, Depressed, Hopeless 3 1 1   PHQ - 2 Score 3 1 1   Altered sleeping 3 1 1   Tired, decreased energy 3 1 1   Change in appetite 0 0 0  Feeling bad or failure about yourself  0 0 0  Trouble concentrating 0 0 0  Moving slowly  or fidgety/restless 0 0 0  Suicidal thoughts 0 0 0  PHQ-9 Score 9 3  3    Difficult doing work/chores Not difficult at all Not difficult at all Not difficult at all     Data saved with a previous flowsheet row definition    BP Readings from Last 3 Encounters:  08/16/24 (!) 140/82  05/17/24 126/84  03/28/24 (!) 145/80    Physical Exam Vitals and nursing note reviewed.  Constitutional:      General: She is not in acute distress.    Appearance: Normal appearance. She is well-developed.  HENT:     Head: Normocephalic and atraumatic.  Neck:     Vascular: No carotid bruit.  Cardiovascular:     Rate and Rhythm: Normal rate and regular rhythm.     Heart sounds: No murmur heard. Pulmonary:     Effort: Pulmonary effort is normal. No respiratory distress.     Breath sounds: No wheezing or rhonchi.  Musculoskeletal:     Cervical back: Normal range of motion.     Right lower leg: No edema.     Left lower leg: No edema.  Lymphadenopathy:     Cervical: No cervical adenopathy.  Skin:    General: Skin is warm and dry.     Findings: No rash.  Neurological:     Mental Status: She is alert and oriented to person, place, and time.  Psychiatric:        Mood and Affect: Mood normal.        Behavior: Behavior normal.    Lab Results  Component Value Date   COLORU yellow 08/16/2024   CLARITYU cloudy 08/16/2024   GLUCOSEUR Negative  08/16/2024   BILIRUBINUR negative 08/16/2024   KETONESU negative 08/16/2024   SPECGRAV 1.015 08/16/2024   RBCUR negative 08/16/2024   PHUR 5.0 08/16/2024   PROTEINUR Negative 08/16/2024   UROBILINOGEN 0.2 08/16/2024   LEUKOCYTESUR Small (1+) (A) 08/16/2024      Wt Readings from Last 3 Encounters:  08/16/24 137 lb (62.1 kg)  05/17/24 136 lb (61.7 kg)  03/28/24 137 lb (62.1 kg)    BP (!) 140/82   Pulse 75   Ht 5' 5 (1.651 m)   Wt 137 lb (62.1 kg)   SpO2 99%   BMI 22.80 kg/m   Assessment and Plan:  Problem List Items Addressed This Visit       Unprioritized   Tobacco use disorder (Chronic)   Chantix  required a PA - prescribed but never received notification from the pharmacy.      Essential hypertension   BP have been up and down despite amlodipine  5 mg daily. She denies excessive caffeine, salt in the diet, cold or sinus medications. Will add low dose metoprolol and recheck in one month.      Relevant Medications   metoprolol succinate (TOPROL-XL) 25 MG 24 hr tablet   Generalized anxiety disorder - Primary   She did not feel normal on Prozac . Will try Lexapro  as she will need something as maintenance in dealing with her mothers worsening dementia Will also give Xanax to take at bedtime prn. Follow up in one month.      Relevant Medications   escitalopram  (LEXAPRO ) 10 MG tablet   ALPRAZolam (XANAX) 0.5 MG tablet   Other Visit Diagnoses       Foul smelling urine       UA essentially clear will send for culture push fluids, try AZO for mild discomfort   Relevant Orders  Urine Culture   POCT urinalysis dipstick (Completed)       Return in about 1 month (around 09/15/2024) for HTN, anxiety.    Leita HILARIO Adie, MD [facility] [place]

## 2024-08-16 NOTE — Assessment & Plan Note (Addendum)
 She did not feel normal on Prozac . Will try Lexapro  as she will need something as maintenance in dealing with her mothers worsening dementia Will also give Xanax to take at bedtime prn. Follow up in one month.

## 2024-08-16 NOTE — Assessment & Plan Note (Signed)
 BP have been up and down despite amlodipine  5 mg daily. She denies excessive caffeine, salt in the diet, cold or sinus medications. Will add low dose metoprolol and recheck in one month.

## 2024-08-16 NOTE — Telephone Encounter (Signed)
 Noted  Pt has appt.  KP

## 2024-08-16 NOTE — Patient Instructions (Signed)
 AZO over the counter for bladder irritation

## 2024-08-16 NOTE — Assessment & Plan Note (Signed)
 Chantix  required a PA - prescribed but never received notification from the pharmacy.

## 2024-08-17 ENCOUNTER — Ambulatory Visit: Admitting: Internal Medicine

## 2024-08-18 ENCOUNTER — Telehealth: Payer: Self-pay

## 2024-08-18 NOTE — Telephone Encounter (Signed)
 Copied from CRM [id number]. Topic: Clinical - Lab/Test Results >> Aug 18, 2024  4:14 PM Delon DASEN wrote: Reason for CRM: calling for urine culture results- please call when they come in [phone]

## 2024-08-19 ENCOUNTER — Telehealth: Payer: Self-pay

## 2024-08-19 NOTE — Telephone Encounter (Signed)
 Copied from CRM [id number]. Topic: Clinical - Lab/Test Results >> Aug 19, 2024  2:09 PM Yvette Barr wrote: Reason for CRM: Patient is calling again in regards to her Urine test. Is asking to see if they can be reviewed today so she can be contacted with the results.  Patient can be reached at [phone]

## 2024-08-19 NOTE — Telephone Encounter (Signed)
 Please review.  KP

## 2024-08-21 ENCOUNTER — Ambulatory Visit: Payer: Self-pay | Admitting: Internal Medicine

## 2024-08-21 DIAGNOSIS — N3000 Acute cystitis without hematuria: Secondary | ICD-10-CM

## 2024-08-21 LAB — URINE CULTURE

## 2024-08-21 MED ORDER — SULFAMETHOXAZOLE-TRIMETHOPRIM 800-160 MG PO TABS: 1.0000 | ORAL_TABLET | Freq: Two times a day (BID) | ORAL | 0 refills | Status: AC

## 2024-08-22 ENCOUNTER — Telehealth: Payer: Self-pay

## 2024-08-22 NOTE — Telephone Encounter (Signed)
 Copied from CRM #[id number]. Topic: Clinical - Lab/Test Results >> Aug 22, 2024 12:15 PM Rachelle R wrote: Reason for CRM: Patient would like to speak to a nurse in regards to her urine results. Wants to know what it was positive for. Gets off work at 3.  Patient can be reached at [phone]

## 2024-08-22 NOTE — Telephone Encounter (Unsigned)
 Copied from CRM #[id number]. Topic: Clinical - Lab/Test Results >> Aug 22, 2024 12:13 PM Yvette Barr wrote: Reason for CRM: The patient called in for her urine culture results. I relayed what her provider stated and she will contact her pharmacy about her antibiotics her provider called in. She has no other questions at this time.

## 2024-08-24 ENCOUNTER — Telehealth: Payer: Self-pay | Admitting: Internal Medicine

## 2024-08-24 NOTE — Telephone Encounter (Signed)
 Copied from CRM #[id number]. Topic: General - Other >> Aug 24, 2024  2:00 PM Selinda RAMAN wrote: Reason for CRM: The patient called in stating she was returning a missed call. She said no voice mail was left. She knows she has an appointment in December and someone already went over her latest urine/lab results. She is requesting a call back and to leave a message if she doesn't answer.

## 2024-08-24 NOTE — Telephone Encounter (Signed)
 Noted  Called pt to go over labs.  KP

## 2024-09-07 ENCOUNTER — Other Ambulatory Visit: Payer: Self-pay | Admitting: Internal Medicine

## 2024-09-07 DIAGNOSIS — F411 Generalized anxiety disorder: Secondary | ICD-10-CM

## 2024-09-09 NOTE — Telephone Encounter (Signed)
 Requested medication (s) are due for refill today - yes  Requested medication (s) are on the active medication list -yes  Future visit scheduled -yes  Last refill: 08/16/24 #30  Notes to clinic: new start Rx- requesting 90 day supply refill- original Rx does not cover this amount- sent for PCP review   Requested Prescriptions  Pending Prescriptions Disp Refills   escitalopram  (LEXAPRO ) 10 MG tablet [Pharmacy Med Name: ESCITALOPRAM  10 MG TABLET] 90 tablet 1    Sig: TAKE 1 TABLET BY MOUTH EVERY DAY     Psychiatry:  Antidepressants - SSRI Passed - 09/09/2024 11:45 AM      Passed - Valid encounter within last 6 months    Recent Outpatient Visits           3 weeks ago Generalized anxiety disorder   [place] [facility] at [facility], Leita DEL, MD   3 months ago Essential hypertension   [place] [facility] at [facility], Leita DEL, MD   5 months ago Essential hypertension   [place] [facility] at [facility], Leita DEL, MD                 Requested Prescriptions  Pending Prescriptions Disp Refills   escitalopram  (LEXAPRO ) 10 MG tablet [Pharmacy Med Name: ESCITALOPRAM  10 MG TABLET] 90 tablet 1    Sig: TAKE 1 TABLET BY MOUTH EVERY DAY     Psychiatry:  Antidepressants - SSRI Passed - 09/09/2024 11:45 AM      Passed - Valid encounter within last 6 months    Recent Outpatient Visits           3 weeks ago Generalized anxiety disorder   [place] [facility] at [facility], Leita DEL, MD   3 months ago Essential hypertension   [place] [facility] at [facility], Leita DEL, MD   5 months ago Essential hypertension   [facility] at [facility] Leita DEL, MD

## 2024-09-12 ENCOUNTER — Ambulatory Visit: Admitting: Internal Medicine

## 2024-09-12 ENCOUNTER — Encounter: Payer: Self-pay | Admitting: Internal Medicine

## 2024-09-12 VITALS — BP 122/68 | HR 76 | Ht 65.0 in | Wt 137.0 lb

## 2024-09-12 DIAGNOSIS — I10 Essential (primary) hypertension: Secondary | ICD-10-CM | POA: Diagnosis not present

## 2024-09-12 DIAGNOSIS — F411 Generalized anxiety disorder: Secondary | ICD-10-CM | POA: Diagnosis not present

## 2024-09-12 DIAGNOSIS — F5101 Primary insomnia: Secondary | ICD-10-CM

## 2024-09-12 MED ORDER — TEMAZEPAM 15 MG PO CAPS: 15.0000 mg | ORAL_CAPSULE | Freq: Every evening | ORAL | 2 refills | Status: AC | PRN

## 2024-09-12 MED ORDER — METOPROLOL SUCCINATE ER 25 MG PO TB24: 25.0000 mg | ORAL_TABLET | Freq: Every day | ORAL | 0 refills | Status: AC

## 2024-09-12 MED ORDER — AMLODIPINE BESYLATE 5 MG PO TABS: 5.0000 mg | ORAL_TABLET | Freq: Every day | ORAL | 0 refills | Status: AC

## 2024-09-12 MED ORDER — ESCITALOPRAM OXALATE 10 MG PO TABS: 10.0000 mg | ORAL_TABLET | Freq: Every day | ORAL | 0 refills | Status: AC

## 2024-09-12 NOTE — Progress Notes (Signed)
 Date:  09/12/2024   Name:  JANEENE SAND   DOB:  [birth year]   MRN:  [id number]   Chief Complaint: Anxiety (Feels med is not working ) and Hypertension  Anxiety Symptoms include insomnia and nervous/anxious behavior. Patient reports no chest pain, dizziness, palpitations or shortness of breath.    Hypertension This is a chronic problem. The problem is controlled. Associated symptoms include anxiety. Pertinent negatives include no chest pain, headaches, palpitations or shortness of breath. Past treatments include beta blockers and calcium channel blockers. The current treatment provides significant improvement.  Insomnia Primary symptoms: sleep disturbance, difficulty falling asleep, frequent awakening.   The problem occurs nightly.    Review of Systems  Constitutional:  Negative for chills and fever.  Respiratory:  Negative for chest tightness and shortness of breath.   Cardiovascular:  Negative for chest pain and palpitations.  Gastrointestinal:  Negative for abdominal pain.  Musculoskeletal:  Negative for arthralgias and joint swelling.  Neurological:  Negative for dizziness, light-headedness and headaches.  Psychiatric/Behavioral:  Positive for sleep disturbance. Negative for dysphoric mood. The patient is nervous/anxious and has insomnia.      Lab Results  Component Value Date   NA 136 03/26/2024   K 3.8 03/26/2024   CO2 20 (L) 03/26/2024   GLUCOSE 120 (H) 03/26/2024   BUN 25 (H) 03/26/2024   CREATININE 0.79 03/26/2024   CALCIUM 9.3 03/26/2024   EGFR 75 01/14/2021   GFRNONAA >60 03/26/2024   Lab Results  Component Value Date   CHOL 171 01/14/2021   HDL 67 01/14/2021   LDLCALC 91 01/14/2021   TRIG 67 01/14/2021   CHOLHDL 2.6 01/14/2021   Lab Results  Component Value Date   TSH 2.080 01/14/2021   No results found for: HGBA1C Lab Results  Component Value Date   WBC 7.5 03/26/2024   HGB 14.2 03/26/2024   HCT 41.2 03/26/2024   MCV 95.6 03/26/2024   PLT  233 03/26/2024   Lab Results  Component Value Date   ALT 18 03/26/2024   AST 28 03/26/2024   ALKPHOS 67 03/26/2024   BILITOT 0.7 03/26/2024   No results found for: MARIEN BOLLS, VD25OH   Patient Active Problem List   Diagnosis Date Noted   Generalized anxiety disorder 05/17/2024   Essential hypertension 03/28/2024   Lumbar radiculopathy 12/14/2023   Osteoarthritis of left hip 12/14/2023   Easy bruising 04/16/2022   BMI 27.0-27.9,adult 04/16/2022   Sessile serrated polyp of colon    Hx of gastritis    HNP (herniated nucleus pulposus), lumbar 11/27/2019   S/P hip replacement, right 08/08/2017   Constipation 07/26/2015   Tobacco use disorder 07/26/2015   Insomnia 07/26/2015   Underweight 07/26/2015    Allergies[1]  Past Surgical History:  Procedure Laterality Date   COLONOSCOPY     COLONOSCOPY WITH PROPOFOL  N/A 03/18/2021   Procedure: COLONOSCOPY WITH PROPOFOL ;  Surgeon: Jinny Carmine, MD;  Location: [facility];  Service: Endoscopy;  Laterality: N/A;   ESOPHAGOGASTRODUODENOSCOPY (EGD) WITH PROPOFOL  N/A 03/18/2021   Procedure: ESOPHAGOGASTRODUODENOSCOPY (EGD) WITH PROPOFOL ;  Surgeon: Jinny Carmine, MD;  Location: [facility];  Service: Endoscopy;  Laterality: N/A;   POLYPECTOMY N/A 03/18/2021   Procedure: POLYPECTOMY;  Surgeon: Jinny Carmine, MD;  Location: [facility];  Service: Endoscopy;  Laterality: N/A;   SIGMOIDOSCOPY     TOTAL HIP ARTHROPLASTY Right 10/26/2017   Procedure: TOTAL HIP ARTHROPLASTY;  Surgeon: Mardee Lynwood SQUIBB, MD;  Location: [facility];  Service: Orthopedics;  Laterality:  Right;    Social History[2]   Medication list has been reviewed and updated.  Active Medications[3]     08/16/2024    3:44 PM 05/17/2024    4:09 PM 03/28/2024    3:20 PM 04/16/2022    3:53 PM  GAD 7 : Generalized Anxiety Score  Nervous, Anxious, on Edge 3 1 1 3   Control/stop worrying 3 0 1 3  Worry too much - different things 3 0 1 2  Trouble  relaxing 3 1 1 2   Restless 0 0 1 2  Easily annoyed or irritable 0 1 1 3   Afraid - awful might happen 0 0 1 2  Total GAD 7 Score 12 3 7 17   Anxiety Difficulty Not difficult at all Not difficult at all Somewhat difficult Somewhat difficult       08/16/2024    3:43 PM 05/17/2024    4:09 PM 03/28/2024    3:19 PM  Depression screen PHQ 2/9  Decreased Interest 0 0 0  Down, Depressed, Hopeless 3 1 1   PHQ - 2 Score 3 1 1   Altered sleeping 3 1 1   Tired, decreased energy 3 1 1   Change in appetite 0 0 0  Feeling bad or failure about yourself  0 0 0  Trouble concentrating 0 0 0  Moving slowly or fidgety/restless 0 0 0  Suicidal thoughts 0 0 0  PHQ-9 Score 9 3  3    Difficult doing work/chores Not difficult at all Not difficult at all Not difficult at all     Data saved with a previous flowsheet row definition    BP Readings from Last 3 Encounters:  09/12/24 122/68  08/16/24 (!) 140/82  05/17/24 126/84    Physical Exam Vitals and nursing note reviewed.  Constitutional:      General: She is not in acute distress.    Appearance: Normal appearance. She is well-developed.  HENT:     Head: Normocephalic and atraumatic.  Cardiovascular:     Rate and Rhythm: Normal rate and regular rhythm.  Pulmonary:     Effort: Pulmonary effort is normal. No respiratory distress.     Breath sounds: No wheezing or rhonchi.  Musculoskeletal:     Cervical back: Normal range of motion.     Right lower leg: No edema.     Left lower leg: No edema.  Skin:    General: Skin is warm and dry.     Findings: No rash.  Neurological:     Mental Status: She is alert and oriented to person, place, and time.  Psychiatric:        Mood and Affect: Mood normal.        Behavior: Behavior normal.     Wt Readings from Last 3 Encounters:  09/12/24 137 lb (62.1 kg)  08/16/24 137 lb (62.1 kg)  05/17/24 136 lb (61.7 kg)    BP 122/68   Pulse 76   Ht 5' 5 (1.651 m)   Wt 137 lb (62.1 kg)   SpO2 96%   BMI 22.80  kg/m   Assessment and Plan:  Problem List Items Addressed This Visit       Unprioritized   Insomnia   Has not benefited from Xanax . Will try Temazepam  taken just before bedtime      Relevant Medications   temazepam  (RESTORIL ) 15 MG capsule   Essential hypertension - Primary   Well controlled blood pressure today. Current regimen is amlodipine  and metoprolol . No medication side effects noted.  Relevant Medications   amLODipine  (NORVASC ) 5 MG tablet   metoprolol  succinate (TOPROL -XL) 25 MG 24 hr tablet   Generalized anxiety disorder   Depression and anxiety symptoms are stable and well controlled on Celexa. No SI/HI reported.  She did not benefit from low dose xanax  at HS. I recommend continuing Celexa but stop Xanax .       Relevant Medications   escitalopram  (LEXAPRO ) 10 MG tablet    Return in about 3 months (around 12/11/2024) for Depression, HTN.    Leita HILARIO Adie, MD [place] [facility] [place]           [1]  Allergies Allergen Reactions   Codeine Other (See Comments)    Family history of codeine allergy with rash  codeine   Morphine And Codeine     Family history of morphine reaction with rash   Morphine Nausea Only    Decreases BP  morphine  [2]  Social History Tobacco Use   Smoking status: Every Day    Current packs/day: 0.50    Average packs/day: 0.5 packs/day for 42.0 years (21.0 ttl pk-yrs)    Types: Cigarettes    Start date: 1984   Smokeless tobacco: Never   Tobacco comments:     Chantix  not covered by insurance  Vaping Use   Vaping status: Never Used  Substance Use Topics   Alcohol use: Yes    Alcohol/week: 2.0 standard drinks of alcohol    Types: 2 Cans of beer per week    Comment: occasional   Drug use: No  [3]  Current Meds  Medication Sig   temazepam  (RESTORIL ) 15 MG capsule Take 1 capsule (15 mg total) by mouth at bedtime as needed for sleep.   varenicline  (CHANTIX  CONTINUING MONTH  PAK) 1 MG tablet Take 1 tablet (1 mg total) by mouth 2 (two) times daily.   [DISCONTINUED] ALPRAZolam  (XANAX ) 0.5 MG tablet Take 1 tablet (0.5 mg total) by mouth at bedtime as needed for anxiety.   [DISCONTINUED] amLODipine  (NORVASC ) 5 MG tablet Take 1 tablet (5 mg total) by mouth daily.   [DISCONTINUED] escitalopram  (LEXAPRO ) 10 MG tablet TAKE 1 TABLET BY MOUTH EVERY DAY   [DISCONTINUED] metoprolol  succinate (TOPROL -XL) 25 MG 24 hr tablet Take 1 tablet (25 mg total) by mouth daily.

## 2024-09-12 NOTE — Assessment & Plan Note (Signed)
 Has not benefited from Xanax . Will try Temazepam  taken just before bedtime

## 2024-09-12 NOTE — Patient Instructions (Signed)
 Call [facility] to schedule your mammogram at [phone].

## 2024-09-12 NOTE — Assessment & Plan Note (Signed)
 Well controlled blood pressure today. Current regimen is amlodipine  and metoprolol. No medication side effects noted.

## 2024-09-12 NOTE — Assessment & Plan Note (Signed)
 Depression and anxiety symptoms are stable and well controlled on Celexa. No SI/HI reported.  She did not benefit from low dose xanax  at HS. I recommend continuing Celexa but stop Xanax .

## 2024-12-06 ENCOUNTER — Ambulatory Visit: Admitting: Student
